# Patient Record
Sex: Male | Born: 1940 | Race: Black or African American | Hispanic: No | Marital: Single
Health system: Southern US, Community
[De-identification: ages and names within clinical notes are randomized; demographics above are authoritative.]

---

## 2005-12-10 ENCOUNTER — Emergency Department: Payer: Self-pay | Admitting: Emergency Medicine

## 2008-03-28 ENCOUNTER — Inpatient Hospital Stay: Payer: Self-pay | Admitting: Internal Medicine

## 2008-06-22 ENCOUNTER — Emergency Department: Payer: Self-pay | Admitting: Emergency Medicine

## 2008-12-01 ENCOUNTER — Emergency Department: Payer: Self-pay | Admitting: Emergency Medicine

## 2008-12-16 ENCOUNTER — Inpatient Hospital Stay: Payer: Self-pay | Admitting: Internal Medicine

## 2009-12-02 ENCOUNTER — Ambulatory Visit: Payer: Self-pay | Admitting: Unknown Physician Specialty

## 2011-02-05 ENCOUNTER — Emergency Department: Payer: Self-pay | Admitting: Emergency Medicine

## 2011-06-01 ENCOUNTER — Ambulatory Visit: Payer: Self-pay | Admitting: Vascular Surgery

## 2011-06-01 LAB — BASIC METABOLIC PANEL
Anion Gap: 8 (ref 7–16)
BUN: 15 mg/dL (ref 7–18)
Co2: 31 mmol/L (ref 21–32)
Creatinine: 1.32 mg/dL — ABNORMAL HIGH (ref 0.60–1.30)
EGFR (African American): >60
EGFR (Non-African Amer.): 57 — ABNORMAL LOW
Glucose: 55 mg/dL — ABNORMAL LOW (ref 65–99)
Potassium: 3.2 mmol/L — ABNORMAL LOW (ref 3.5–5.1)

## 2011-06-01 LAB — PROTIME-INR
INR: 1.8
Prothrombin Time: 21.3 secs — ABNORMAL HIGH (ref 11.5–14.7)

## 2011-12-16 ENCOUNTER — Ambulatory Visit: Payer: Self-pay | Admitting: Cardiology

## 2011-12-16 LAB — CBC WITH DIFFERENTIAL/PLATELET
Basophil #: 0 10*3/uL (ref 0.0–0.1)
Basophil %: 1.1 %
Eosinophil %: 1.3 %
HGB: 13 g/dL (ref 13.0–18.0)
Lymphocyte #: 1 10*3/uL (ref 1.0–3.6)
Lymphocyte %: 28 %
MCH: 27.7 pg (ref 26.0–34.0)
MCHC: 33.4 g/dL (ref 32.0–36.0)
MCV: 83 fL (ref 80–100)
Monocyte #: 0.8 x10 3/mm (ref 0.2–1.0)
Monocyte %: 20.4 %
Platelet: 144 10*3/uL — ABNORMAL LOW (ref 150–440)
RBC: 4.68 10*6/uL (ref 4.40–5.90)

## 2011-12-16 LAB — BASIC METABOLIC PANEL
Anion Gap: 7 (ref 7–16)
BUN: 16 mg/dL (ref 7–18)
Calcium, Total: 8.4 mg/dL — ABNORMAL LOW (ref 8.5–10.1)
Co2: 30 mmol/L (ref 21–32)
Creatinine: 1.34 mg/dL — ABNORMAL HIGH (ref 0.60–1.30)
EGFR (African American): >60
EGFR (Non-African Amer.): 52 — ABNORMAL LOW
Osmolality: 276 (ref 275–301)
Potassium: 3.8 mmol/L (ref 3.5–5.1)

## 2011-12-16 LAB — PROTIME-INR
INR: 2
Prothrombin Time: 23 secs — ABNORMAL HIGH (ref 11.5–14.7)

## 2011-12-24 ENCOUNTER — Ambulatory Visit: Payer: Self-pay | Admitting: Cardiology

## 2011-12-24 LAB — PROTIME-INR
INR: 1.2
Prothrombin Time: 15.3 secs — ABNORMAL HIGH (ref 11.5–14.7)

## 2012-01-30 ENCOUNTER — Inpatient Hospital Stay: Payer: Self-pay | Admitting: Internal Medicine

## 2012-01-30 LAB — COMPREHENSIVE METABOLIC PANEL
Albumin: 3.7 g/dL (ref 3.4–5.0)
Alkaline Phosphatase: 134 U/L (ref 50–136)
Anion Gap: 5 — ABNORMAL LOW (ref 7–16)
BUN: 20 mg/dL — ABNORMAL HIGH (ref 7–18)
Bilirubin,Total: 1.4 mg/dL — ABNORMAL HIGH (ref 0.2–1.0)
Chloride: 104 mmol/L (ref 98–107)
Creatinine: 1.21 mg/dL (ref 0.60–1.30)
Glucose: 44 mg/dL — ABNORMAL LOW (ref 65–99)
Osmolality: 275 (ref 275–301)
Potassium: 3.6 mmol/L (ref 3.5–5.1)
SGOT(AST): 46 U/L — ABNORMAL HIGH (ref 15–37)
Sodium: 138 mmol/L (ref 136–145)
Total Protein: 7.7 g/dL (ref 6.4–8.2)

## 2012-01-30 LAB — CBC
HCT: 35.7 % — ABNORMAL LOW (ref 40.0–52.0)
HGB: 12.1 g/dL — ABNORMAL LOW (ref 13.0–18.0)
MCV: 83 fL (ref 80–100)
Platelet: 135 10*3/uL — ABNORMAL LOW (ref 150–440)
RBC: 4.32 10*6/uL — ABNORMAL LOW (ref 4.40–5.90)
WBC: 6.7 10*3/uL (ref 3.8–10.6)

## 2012-01-30 LAB — PROTIME-INR
INR: 3
Prothrombin Time: 30.9 secs — ABNORMAL HIGH (ref 11.5–14.7)

## 2012-01-30 LAB — CK TOTAL AND CKMB (NOT AT ARMC)
CK, Total: 333 U/L — ABNORMAL HIGH (ref 35–232)
CK, Total: 331 U/L — ABNORMAL HIGH (ref 35–232)
CK-MB: 7.3 ng/mL — ABNORMAL HIGH (ref 0.5–3.6)
CK-MB: 7.4 ng/mL — ABNORMAL HIGH (ref 0.5–3.6)

## 2012-01-30 LAB — TROPONIN I
Troponin-I: 0.26 ng/mL — ABNORMAL HIGH
Troponin-I: 0.24 ng/mL — ABNORMAL HIGH

## 2012-01-31 ENCOUNTER — Ambulatory Visit: Payer: Self-pay | Admitting: Internal Medicine

## 2012-01-31 LAB — PROTIME-INR
INR: 3.3
Prothrombin Time: 33.5 secs — ABNORMAL HIGH (ref 11.5–14.7)

## 2012-01-31 LAB — BASIC METABOLIC PANEL
Anion Gap: 8 (ref 7–16)
BUN: 23 mg/dL — ABNORMAL HIGH (ref 7–18)
Chloride: 103 mmol/L (ref 98–107)
Co2: 29 mmol/L (ref 21–32)
Creatinine: 1.39 mg/dL — ABNORMAL HIGH (ref 0.60–1.30)
EGFR (Non-African Amer.): 50 — ABNORMAL LOW

## 2012-01-31 LAB — CBC WITH DIFFERENTIAL/PLATELET
Basophil %: 0.8 %
Eosinophil #: 0 10*3/uL (ref 0.0–0.7)
Eosinophil %: 0 %
HCT: 35.4 % — ABNORMAL LOW (ref 40.0–52.0)
Lymphocyte #: 1.1 10*3/uL (ref 1.0–3.6)
MCH: 27.4 pg (ref 26.0–34.0)
MCHC: 33.2 g/dL (ref 32.0–36.0)
MCV: 83 fL (ref 80–100)
Monocyte #: 0.8 x10 3/mm (ref 0.2–1.0)
Monocyte %: 12.6 %
Neutrophil #: 4.4 10*3/uL (ref 1.4–6.5)
Platelet: 135 10*3/uL — ABNORMAL LOW (ref 150–440)
RBC: 4.28 10*6/uL — ABNORMAL LOW (ref 4.40–5.90)

## 2012-01-31 LAB — TROPONIN I: Troponin-I: 0.15 ng/mL — ABNORMAL HIGH

## 2012-01-31 LAB — CK TOTAL AND CKMB (NOT AT ARMC)
CK, Total: 322 U/L — ABNORMAL HIGH (ref 35–232)
CK-MB: 4 ng/mL — ABNORMAL HIGH (ref 0.5–3.6)

## 2012-02-01 LAB — BASIC METABOLIC PANEL
Calcium, Total: 8.5 mg/dL (ref 8.5–10.1)
Chloride: 102 mmol/L (ref 98–107)
Creatinine: 1.18 mg/dL (ref 0.60–1.30)
EGFR (Non-African Amer.): >60
Glucose: 71 mg/dL (ref 65–99)
Osmolality: 276 (ref 275–301)
Potassium: 4.4 mmol/L (ref 3.5–5.1)
Sodium: 137 mmol/L (ref 136–145)

## 2012-02-01 LAB — PROTIME-INR
INR: 2.6
Prothrombin Time: 28 secs — ABNORMAL HIGH (ref 11.5–14.7)

## 2012-02-01 LAB — HEMOGLOBIN: HGB: 11.8 g/dL — ABNORMAL LOW (ref 13.0–18.0)

## 2012-02-25 ENCOUNTER — Inpatient Hospital Stay: Payer: Self-pay | Admitting: Internal Medicine

## 2012-02-25 LAB — PROTIME-INR: Prothrombin Time: 26.3 secs — ABNORMAL HIGH (ref 11.5–14.7)

## 2012-02-25 LAB — URINALYSIS, COMPLETE
Bacteria: NONE SEEN
Bilirubin,UR: NEGATIVE
Protein: 30
Squamous Epithelial: NONE SEEN
WBC UR: <1 /HPF (ref 0–5)

## 2012-02-25 LAB — COMPREHENSIVE METABOLIC PANEL
Albumin: 3.6 g/dL (ref 3.4–5.0)
Anion Gap: 8 (ref 7–16)
BUN: 28 mg/dL — ABNORMAL HIGH (ref 7–18)
Bilirubin,Total: 2 mg/dL — ABNORMAL HIGH (ref 0.2–1.0)
Creatinine: 1.18 mg/dL (ref 0.60–1.30)
EGFR (African American): >60
EGFR (Non-African Amer.): >60
Glucose: 154 mg/dL — ABNORMAL HIGH (ref 65–99)
Potassium: 5.6 mmol/L — ABNORMAL HIGH (ref 3.5–5.1)
SGOT(AST): 82 U/L — ABNORMAL HIGH (ref 15–37)
Sodium: 135 mmol/L — ABNORMAL LOW (ref 136–145)
Total Protein: 8 g/dL (ref 6.4–8.2)

## 2012-02-25 LAB — CBC
HGB: 12.9 g/dL — ABNORMAL LOW (ref 13.0–18.0)
MCH: 27.6 pg (ref 26.0–34.0)
MCV: 83 fL (ref 80–100)
Platelet: 137 10*3/uL — ABNORMAL LOW (ref 150–440)
RBC: 4.65 10*6/uL (ref 4.40–5.90)

## 2012-02-26 LAB — BASIC METABOLIC PANEL
Calcium, Total: 8.2 mg/dL — ABNORMAL LOW (ref 8.5–10.1)
Co2: 27 mmol/L (ref 21–32)
EGFR (African American): 44 — ABNORMAL LOW
Glucose: 88 mg/dL (ref 65–99)
Osmolality: 281 (ref 275–301)
Potassium: 3.8 mmol/L (ref 3.5–5.1)
Sodium: 138 mmol/L (ref 136–145)

## 2012-02-26 LAB — CBC WITH DIFFERENTIAL/PLATELET
Basophil #: 0 10*3/uL (ref 0.0–0.1)
Eosinophil %: 0.1 %
Lymphocyte #: 1.1 10*3/uL (ref 1.0–3.6)
MCH: 27.3 pg (ref 26.0–34.0)
MCV: 82 fL (ref 80–100)
Monocyte #: 1.6 x10 3/mm — ABNORMAL HIGH (ref 0.2–1.0)
Monocyte %: 17.3 %
Neutrophil #: 6.6 10*3/uL — ABNORMAL HIGH (ref 1.4–6.5)
Neutrophil %: 70.7 %
Platelet: 123 10*3/uL — ABNORMAL LOW (ref 150–440)
RDW: 16.7 % — ABNORMAL HIGH (ref 11.5–14.5)
WBC: 9.4 10*3/uL (ref 3.8–10.6)

## 2012-02-26 LAB — TROPONIN I: Troponin-I: 17 ng/mL — ABNORMAL HIGH

## 2012-02-26 LAB — MAGNESIUM: Magnesium: 1.9 mg/dL

## 2012-02-26 LAB — CK: CK, Total: 3630 U/L — ABNORMAL HIGH (ref 35–232)

## 2012-02-26 LAB — APTT: Activated PTT: >160 secs (ref 23.6–35.9)

## 2012-02-26 LAB — HEMOGLOBIN: HGB: 11.5 g/dL — ABNORMAL LOW (ref 13.0–18.0)

## 2012-02-27 ENCOUNTER — Ambulatory Visit: Payer: Self-pay | Admitting: Neurology

## 2012-02-27 LAB — BASIC METABOLIC PANEL
Anion Gap: 8 (ref 7–16)
BUN: 31 mg/dL — ABNORMAL HIGH (ref 7–18)
Co2: 29 mmol/L (ref 21–32)
Creatinine: 1.6 mg/dL — ABNORMAL HIGH (ref 0.60–1.30)
EGFR (African American): 49 — ABNORMAL LOW
EGFR (Non-African Amer.): 42 — ABNORMAL LOW
Glucose: 279 mg/dL — ABNORMAL HIGH (ref 65–99)
Osmolality: 292 (ref 275–301)
Sodium: 138 mmol/L (ref 136–145)

## 2012-02-27 LAB — CBC WITH DIFFERENTIAL/PLATELET
Basophil #: 0.1 10*3/uL (ref 0.0–0.1)
Basophil %: 0.6 %
Eosinophil #: 0 10*3/uL (ref 0.0–0.7)
HCT: 34.6 % — ABNORMAL LOW (ref 40.0–52.0)
Lymphocyte %: 6.4 %
MCHC: 34.6 g/dL (ref 32.0–36.0)
Monocyte #: 1.1 x10 3/mm — ABNORMAL HIGH (ref 0.2–1.0)
Monocyte %: 10.1 %
Neutrophil %: 82.9 %
Platelet: 133 10*3/uL — ABNORMAL LOW (ref 150–440)
RBC: 4.2 10*6/uL — ABNORMAL LOW (ref 4.40–5.90)
RDW: 16.4 % — ABNORMAL HIGH (ref 11.5–14.5)
WBC: 10.6 10*3/uL (ref 3.8–10.6)

## 2012-02-27 LAB — URINE CULTURE

## 2012-02-28 LAB — CBC WITH DIFFERENTIAL/PLATELET
Basophil #: 0 10*3/uL (ref 0.0–0.1)
Basophil %: 0.2 %
Eosinophil #: 0 10*3/uL (ref 0.0–0.7)
HCT: 32 % — ABNORMAL LOW (ref 40.0–52.0)
HGB: 11.1 g/dL — ABNORMAL LOW (ref 13.0–18.0)
Lymphocyte #: 0.7 10*3/uL — ABNORMAL LOW (ref 1.0–3.6)
MCH: 28.5 pg (ref 26.0–34.0)
MCHC: 34.7 g/dL (ref 32.0–36.0)
Monocyte #: 0.9 x10 3/mm (ref 0.2–1.0)
Monocyte %: 9.2 %
Neutrophil #: 8.7 10*3/uL — ABNORMAL HIGH (ref 1.4–6.5)
Neutrophil %: 84 %
RBC: 3.9 10*6/uL — ABNORMAL LOW (ref 4.40–5.90)
RDW: 16.7 % — ABNORMAL HIGH (ref 11.5–14.5)
WBC: 10.3 10*3/uL (ref 3.8–10.6)

## 2012-02-28 LAB — BASIC METABOLIC PANEL
Chloride: 106 mmol/L (ref 98–107)
Co2: 30 mmol/L (ref 21–32)
Creatinine: 1.72 mg/dL — ABNORMAL HIGH (ref 0.60–1.30)
EGFR (African American): 45 — ABNORMAL LOW
Potassium: 3.4 mmol/L — ABNORMAL LOW (ref 3.5–5.1)
Sodium: 145 mmol/L (ref 136–145)

## 2012-02-28 LAB — PHOSPHORUS: Phosphorus: 2.9 mg/dL (ref 2.5–4.9)

## 2012-02-28 LAB — POTASSIUM: Potassium: 3.6 mmol/L (ref 3.5–5.1)

## 2012-02-29 LAB — CBC WITH DIFFERENTIAL/PLATELET
Basophil #: 0 10*3/uL (ref 0.0–0.1)
Basophil %: 0.4 %
Eosinophil %: 0 %
HCT: 32.4 % — ABNORMAL LOW (ref 40.0–52.0)
HGB: 10.6 g/dL — ABNORMAL LOW (ref 13.0–18.0)
Lymphocyte #: 0.9 10*3/uL — ABNORMAL LOW (ref 1.0–3.6)
MCH: 27.1 pg (ref 26.0–34.0)
MCV: 83 fL (ref 80–100)
Monocyte #: 1.2 x10 3/mm — ABNORMAL HIGH (ref 0.2–1.0)
Monocyte %: 11.7 %
Neutrophil #: 7.9 10*3/uL — ABNORMAL HIGH (ref 1.4–6.5)
RBC: 3.91 10*6/uL — ABNORMAL LOW (ref 4.40–5.90)
WBC: 10 10*3/uL (ref 3.8–10.6)

## 2012-02-29 LAB — BASIC METABOLIC PANEL
Anion Gap: 6 — ABNORMAL LOW (ref 7–16)
BUN: 31 mg/dL — ABNORMAL HIGH (ref 7–18)
Chloride: 107 mmol/L (ref 98–107)
Co2: 30 mmol/L (ref 21–32)
Creatinine: 1.54 mg/dL — ABNORMAL HIGH (ref 0.60–1.30)
EGFR (Non-African Amer.): 44 — ABNORMAL LOW
Potassium: 3.8 mmol/L (ref 3.5–5.1)
Sodium: 143 mmol/L (ref 136–145)

## 2012-03-01 LAB — CBC WITH DIFFERENTIAL/PLATELET
Basophil %: 0.2 %
Eosinophil %: 0 %
HCT: 31.8 % — ABNORMAL LOW (ref 40.0–52.0)
HGB: 10.4 g/dL — ABNORMAL LOW (ref 13.0–18.0)
Lymphocyte %: 3.2 %
MCHC: 32.6 g/dL (ref 32.0–36.0)
MCV: 83 fL (ref 80–100)
Monocyte %: 3.1 %
Neutrophil #: 10.6 10*3/uL — ABNORMAL HIGH (ref 1.4–6.5)
Neutrophil %: 93.5 %
WBC: 11.3 10*3/uL — ABNORMAL HIGH (ref 3.8–10.6)

## 2012-03-01 LAB — BASIC METABOLIC PANEL
Anion Gap: 8 (ref 7–16)
BUN: 29 mg/dL — ABNORMAL HIGH (ref 7–18)
Calcium, Total: 8.4 mg/dL — ABNORMAL LOW (ref 8.5–10.1)
Chloride: 110 mmol/L — ABNORMAL HIGH (ref 98–107)
Co2: 27 mmol/L (ref 21–32)
Creatinine: 1.35 mg/dL — ABNORMAL HIGH (ref 0.60–1.30)
Potassium: 4.3 mmol/L (ref 3.5–5.1)
Sodium: 145 mmol/L (ref 136–145)

## 2012-03-01 LAB — CULTURE, BLOOD (SINGLE)

## 2012-03-02 ENCOUNTER — Ambulatory Visit: Payer: Self-pay | Admitting: Internal Medicine

## 2012-03-02 LAB — BASIC METABOLIC PANEL
Anion Gap: 6 — ABNORMAL LOW (ref 7–16)
BUN: 38 mg/dL — ABNORMAL HIGH (ref 7–18)
Chloride: 110 mmol/L — ABNORMAL HIGH (ref 98–107)
EGFR (African American): 55 — ABNORMAL LOW
EGFR (Non-African Amer.): 48 — ABNORMAL LOW
Glucose: 217 mg/dL — ABNORMAL HIGH (ref 65–99)
Osmolality: 302 (ref 275–301)
Sodium: 144 mmol/L (ref 136–145)

## 2012-03-02 LAB — URINALYSIS, COMPLETE
Bacteria: NONE SEEN
Bilirubin,UR: NEGATIVE
Ketone: NEGATIVE
Leukocyte Esterase: NEGATIVE
Nitrite: NEGATIVE
Ph: 6 (ref 4.5–8.0)
Protein: NEGATIVE
RBC,UR: 49 /HPF (ref 0–5)
Specific Gravity: 1.023 (ref 1.003–1.030)
Squamous Epithelial: NONE SEEN
WBC UR: NONE SEEN /HPF (ref 0–5)

## 2012-03-02 LAB — CBC WITH DIFFERENTIAL/PLATELET
Basophil %: 0.2 %
Eosinophil #: 0 10*3/uL (ref 0.0–0.7)
Eosinophil %: 0 %
HGB: 9.9 g/dL — ABNORMAL LOW (ref 13.0–18.0)
Lymphocyte %: 2.6 %
MCHC: 31.7 g/dL — ABNORMAL LOW (ref 32.0–36.0)
MCV: 84 fL (ref 80–100)
Neutrophil #: 12.2 10*3/uL — ABNORMAL HIGH (ref 1.4–6.5)
Neutrophil %: 92.7 %
Platelet: 180 10*3/uL (ref 150–440)
WBC: 13.2 10*3/uL — ABNORMAL HIGH (ref 3.8–10.6)

## 2012-03-03 LAB — BASIC METABOLIC PANEL
Anion Gap: 5 — ABNORMAL LOW (ref 7–16)
BUN: 39 mg/dL — ABNORMAL HIGH (ref 7–18)
Calcium, Total: 8.1 mg/dL — ABNORMAL LOW (ref 8.5–10.1)
Chloride: 108 mmol/L — ABNORMAL HIGH (ref 98–107)
EGFR (African American): >60
Glucose: 166 mg/dL — ABNORMAL HIGH (ref 65–99)
Osmolality: 294 (ref 275–301)

## 2012-03-03 LAB — URINE CULTURE

## 2012-03-04 LAB — CBC WITH DIFFERENTIAL/PLATELET
Basophil #: 0 10*3/uL (ref 0.0–0.1)
Basophil %: 0.1 %
Eosinophil #: 0 10*3/uL (ref 0.0–0.7)
Eosinophil %: 0 %
HCT: 30.8 % — ABNORMAL LOW (ref 40.0–52.0)
HGB: 10.5 g/dL — ABNORMAL LOW (ref 13.0–18.0)
Lymphocyte #: 1.2 10*3/uL (ref 1.0–3.6)
Lymphocyte %: 7.2 %
MCH: 28.2 pg (ref 26.0–34.0)
MCHC: 34 g/dL (ref 32.0–36.0)
Neutrophil #: 14.5 10*3/uL — ABNORMAL HIGH (ref 1.4–6.5)
Platelet: 225 10*3/uL (ref 150–440)
WBC: 16.9 10*3/uL — ABNORMAL HIGH (ref 3.8–10.6)

## 2012-03-04 LAB — BASIC METABOLIC PANEL
Anion Gap: 8 (ref 7–16)
BUN: 37 mg/dL — ABNORMAL HIGH (ref 7–18)
Chloride: 110 mmol/L — ABNORMAL HIGH (ref 98–107)
Co2: 26 mmol/L (ref 21–32)
Creatinine: 1.4 mg/dL — ABNORMAL HIGH (ref 0.60–1.30)
EGFR (African American): 57 — ABNORMAL LOW
EGFR (Non-African Amer.): 49 — ABNORMAL LOW
Potassium: 4.4 mmol/L (ref 3.5–5.1)
Sodium: 144 mmol/L (ref 136–145)

## 2012-03-05 ENCOUNTER — Ambulatory Visit: Payer: Self-pay | Admitting: Neurology

## 2012-03-05 LAB — BASIC METABOLIC PANEL
Anion Gap: 9 (ref 7–16)
BUN: 47 mg/dL — ABNORMAL HIGH (ref 7–18)
Calcium, Total: 8 mg/dL — ABNORMAL LOW (ref 8.5–10.1)
Chloride: 110 mmol/L — ABNORMAL HIGH (ref 98–107)
Co2: 25 mmol/L (ref 21–32)
EGFR (African American): 50 — ABNORMAL LOW
EGFR (Non-African Amer.): 43 — ABNORMAL LOW
Glucose: 186 mg/dL — ABNORMAL HIGH (ref 65–99)
Osmolality: 304 (ref 275–301)
Potassium: 4.7 mmol/L (ref 3.5–5.1)
Sodium: 144 mmol/L (ref 136–145)

## 2012-03-05 LAB — CBC WITH DIFFERENTIAL/PLATELET
Bands: 2 %
HCT: 31 % — ABNORMAL LOW (ref 40.0–52.0)
HGB: 10.3 g/dL — ABNORMAL LOW (ref 13.0–18.0)
Lymphocytes: 9 %
MCH: 27.8 pg (ref 26.0–34.0)
MCHC: 33.2 g/dL (ref 32.0–36.0)
Metamyelocyte: 1 %
Monocytes: 3 %
NRBC/100 WBC: 2 /
Platelet: 256 10*3/uL (ref 150–440)
WBC: 20.7 10*3/uL — ABNORMAL HIGH (ref 3.8–10.6)

## 2012-03-06 LAB — BASIC METABOLIC PANEL
Anion Gap: 7 (ref 7–16)
BUN: 53 mg/dL — ABNORMAL HIGH (ref 7–18)
Calcium, Total: 8 mg/dL — ABNORMAL LOW (ref 8.5–10.1)
Chloride: 110 mmol/L — ABNORMAL HIGH (ref 98–107)
Co2: 27 mmol/L (ref 21–32)
Potassium: 4.5 mmol/L (ref 3.5–5.1)
Sodium: 144 mmol/L (ref 136–145)

## 2012-03-06 LAB — CBC WITH DIFFERENTIAL/PLATELET
Eosinophil #: 0.1 10*3/uL (ref 0.0–0.7)
Lymphocyte #: 0.6 10*3/uL — ABNORMAL LOW (ref 1.0–3.6)
MCH: 27.3 pg (ref 26.0–34.0)
MCHC: 32.8 g/dL (ref 32.0–36.0)
Neutrophil #: 20.7 10*3/uL — ABNORMAL HIGH (ref 1.4–6.5)
Neutrophil %: 91.5 %
Platelet: 268 10*3/uL (ref 150–440)
WBC: 22.6 10*3/uL — ABNORMAL HIGH (ref 3.8–10.6)

## 2012-03-08 LAB — BASIC METABOLIC PANEL
Co2: 26 mmol/L (ref 21–32)
Creatinine: 1.61 mg/dL — ABNORMAL HIGH (ref 0.60–1.30)
EGFR (Non-African Amer.): 42 — ABNORMAL LOW
Glucose: 150 mg/dL — ABNORMAL HIGH (ref 65–99)
Potassium: 4.8 mmol/L (ref 3.5–5.1)

## 2012-03-08 LAB — CBC WITH DIFFERENTIAL/PLATELET
Eosinophil #: 0 10*3/uL (ref 0.0–0.7)
Eosinophil %: 0 %
HGB: 12 g/dL — ABNORMAL LOW (ref 13.0–18.0)
Lymphocyte #: 0.7 10*3/uL — ABNORMAL LOW (ref 1.0–3.6)
Lymphocyte %: 2.6 %
MCHC: 36.1 g/dL — ABNORMAL HIGH (ref 32.0–36.0)
MCV: 84 fL (ref 80–100)
Monocyte #: 2 x10 3/mm — ABNORMAL HIGH (ref 0.2–1.0)
Neutrophil #: 25.7 10*3/uL — ABNORMAL HIGH (ref 1.4–6.5)
Neutrophil %: 90 %
Platelet: 306 10*3/uL (ref 150–440)
WBC: 28.5 10*3/uL — ABNORMAL HIGH (ref 3.8–10.6)

## 2012-03-08 LAB — CULTURE, BLOOD (SINGLE)

## 2012-04-02 ENCOUNTER — Ambulatory Visit: Payer: Self-pay | Admitting: Internal Medicine

## 2012-04-02 DEATH — deceased

## 2013-06-07 IMAGING — CT CT HEAD WITHOUT CONTRAST
2 series · 15 of 30 positions shown, 19 images · non-contrast
Comparison: none

REASON FOR EXAM: unresponsive
COMMENTS:

PROCEDURE:     CT  - CT HEAD WITHOUT CONTRAST  - February 25, 2012  [DATE]
RESULT:     Comparison:  12/01/2008
TECHNIQUE: Multiple axial images from the foramen magnum to the vertex were
obtained without IV contrast.

[Series 2: without · axial · non-contrast · 0.46mm/px · z∈[+250,+384]mm · 13 of 33 slices shown, 17 images]
[im 3/33  brain]
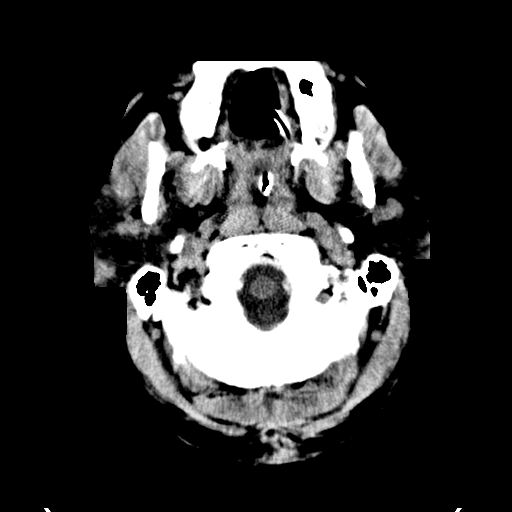
[im 3/33  bone]
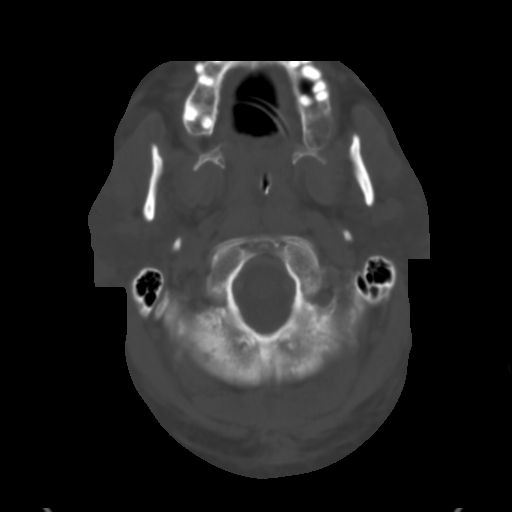
[im 5/33  brain]
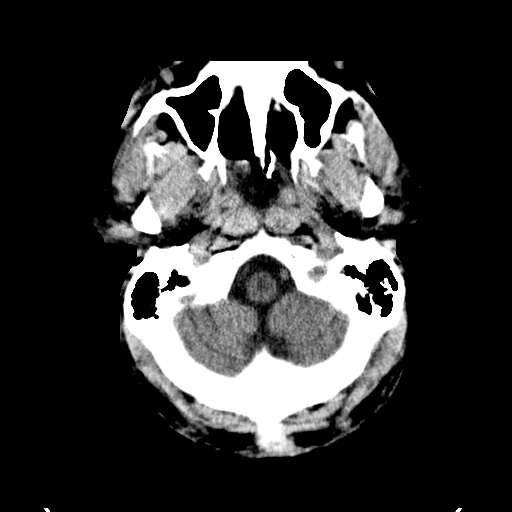
[im 7/33  brain]
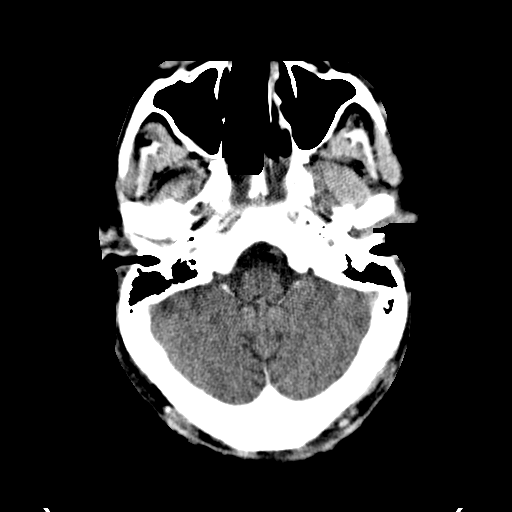
[im 10/33  brain]
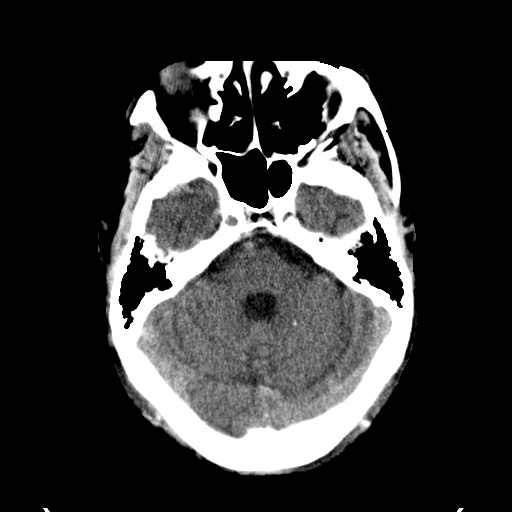
[im 12/33  brain]
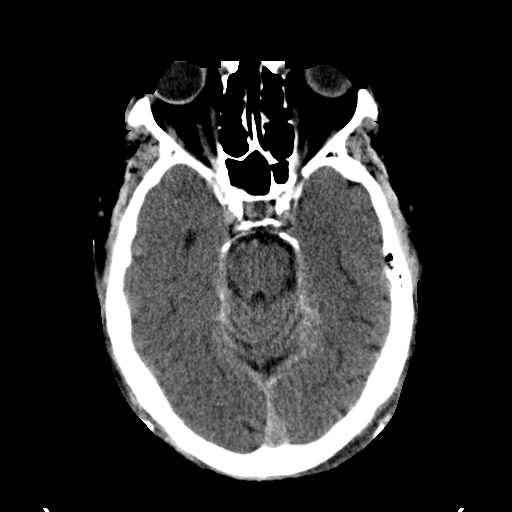
[im 12/33  bone]
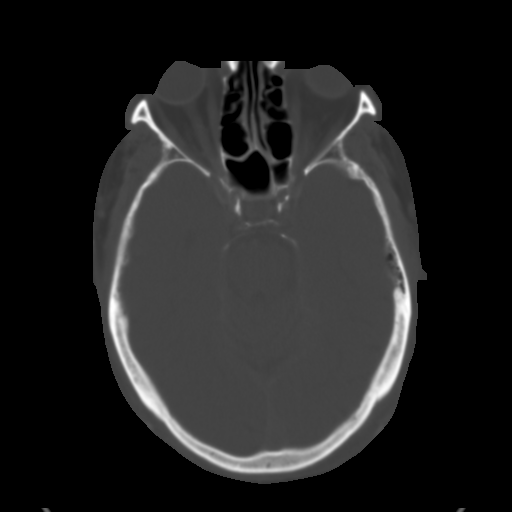
[im 14/33  brain]
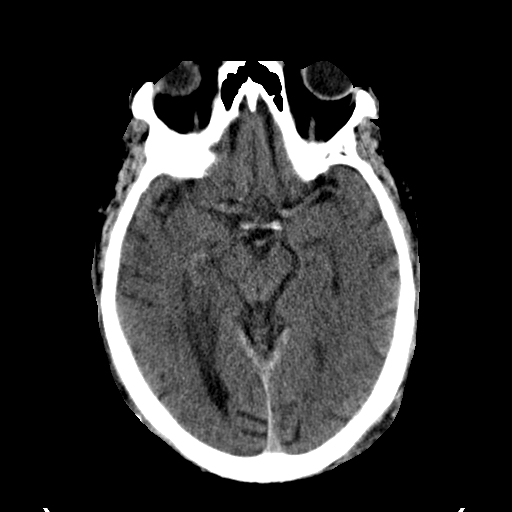
[im 17/33  brain]
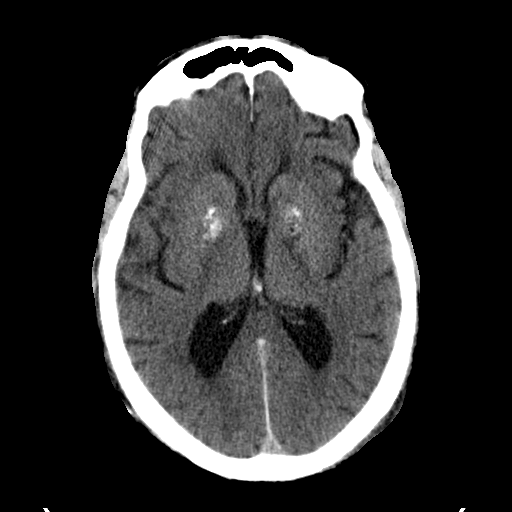
[im 19/33  brain]
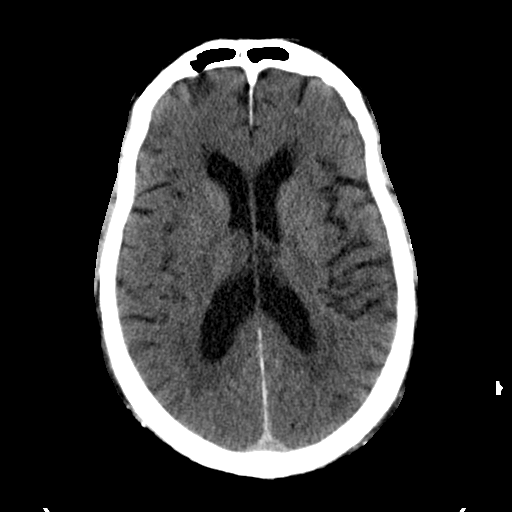
[im 21/33  brain]
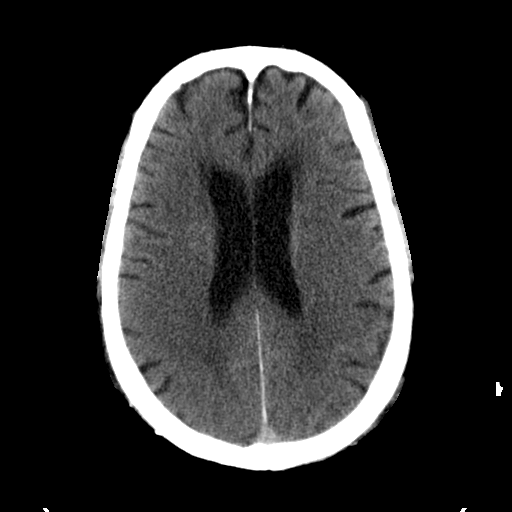
[im 21/33  bone]
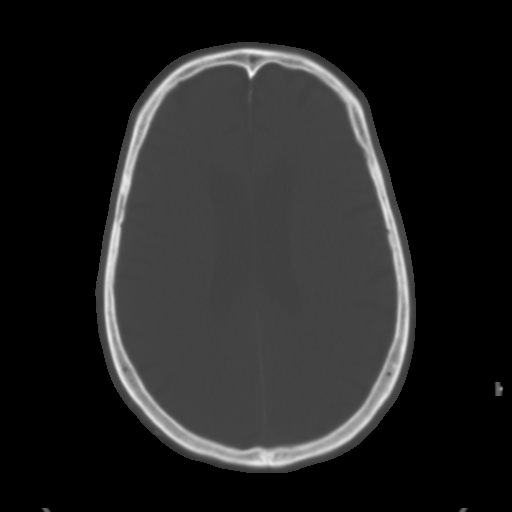
[im 23/33  brain]
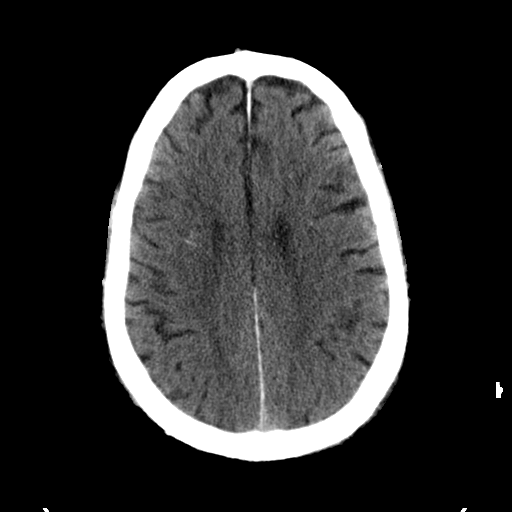
[im 26/33  brain]
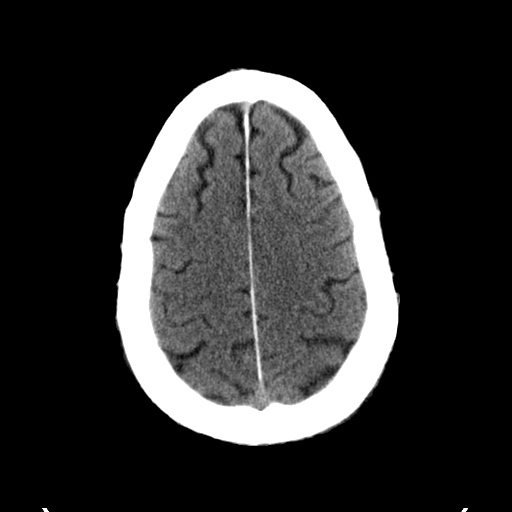
[im 28/33  brain]
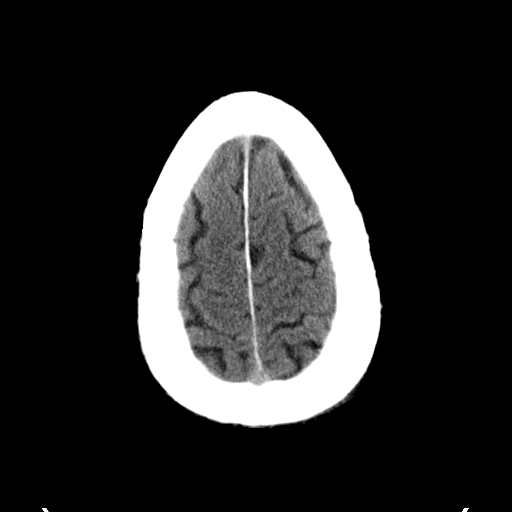
[im 30/33  brain]
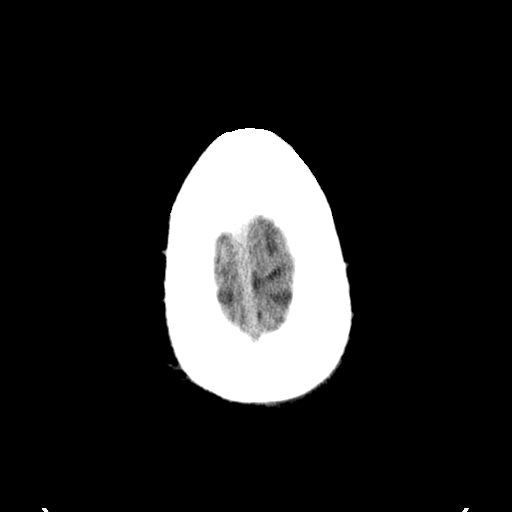
[im 30/33  bone]
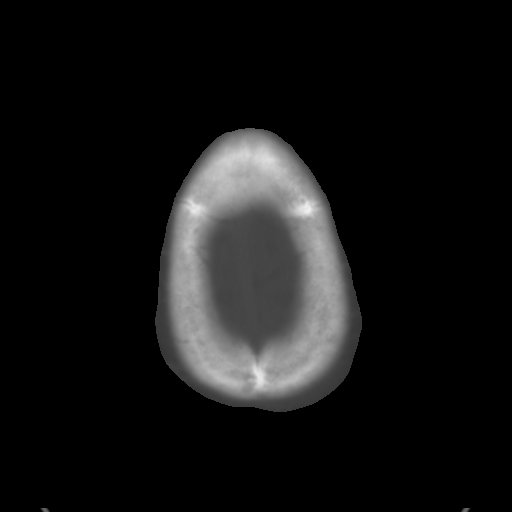

[Series 3: bone · axial · 0.46mm/px · z∈[+250,+270]mm · 2 of 33 slices shown]
[im 3/33  bone]
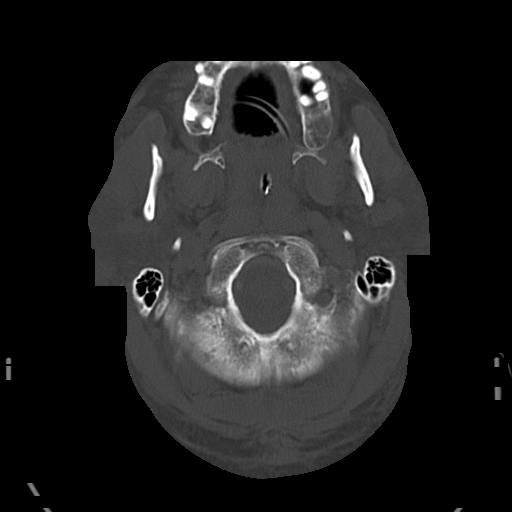
[im 7/33  bone]
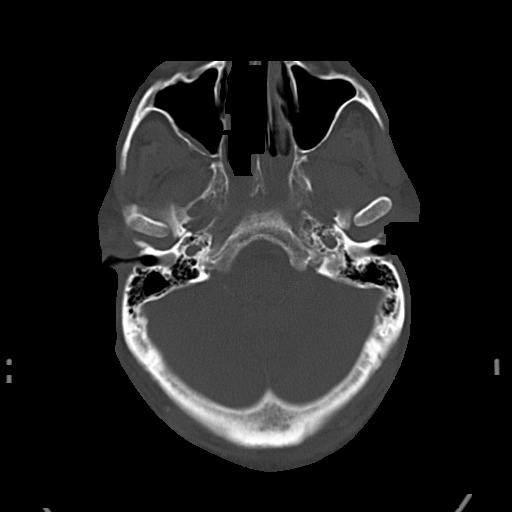

[15 of 30 positions shown; findings below may reference images not displayed]

FINDINGS: There is no evidence for mass effect, midline shift, or extra-axial fluid
collections. There is no evidence for space-occupying lesion, intracranial
hemorrhage, or cortical-based area of infarction. Periventricular and
subcortical hypoattenuation is consistent with chronic small vessel ischemic
disease.

There is a trace amount of mucosal thickening in the right maxillary sinus.
An enteric tube and endotracheal tube are incompletely visualized.

The osseous structures are unremarkable.
IMPRESSION: 1. No acute intracranial process.
2. Chronic small vessel ischemic disease.

CT can underestimate ischemia in the first 24 hours after the event. If
there is clinical concern for an acute infarct, a followup MRI or repeat CT
scan in 24 hours may provide additional information.

[REDACTED]

## 2013-06-09 IMAGING — CT CT HEAD WITHOUT CONTRAST
1 series · 16 of 30 positions shown, 20 images · non-contrast
Comparison: none

REASON FOR EXAM: coma
COMMENTS:

PROCEDURE:     CT  - CT HEAD WITHOUT CONTRAST  - February 27, 2012  [DATE]
RESULT:     History: Coma.
Comparison Study: Prior CT of 02/25/2012.

[Series 2: soft tissue · axial · 0.44mm/px · z∈[-163,-13]mm · 16 of 34 slices shown, 20 images]
[im 2/34  brain]
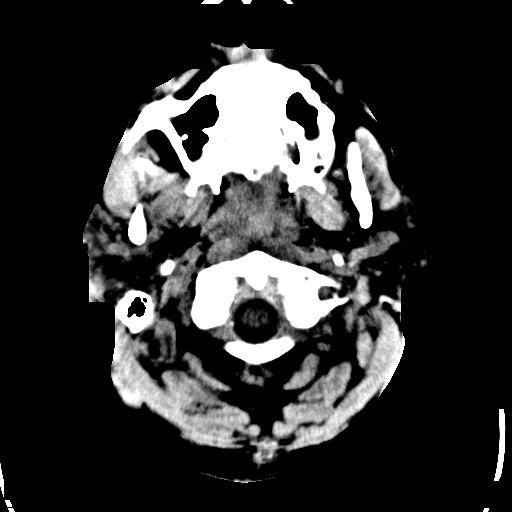
[im 2/34  bone]
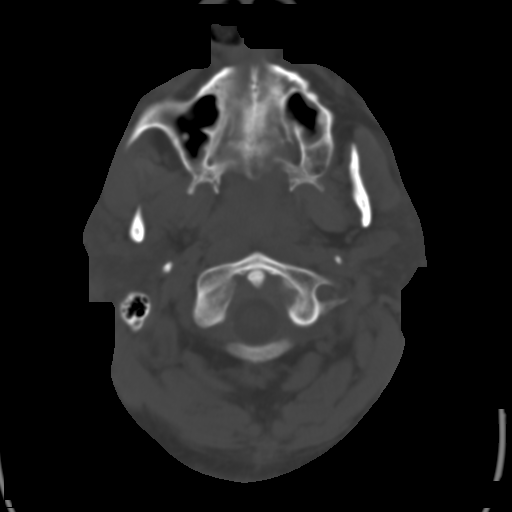
[im 4/34  brain]
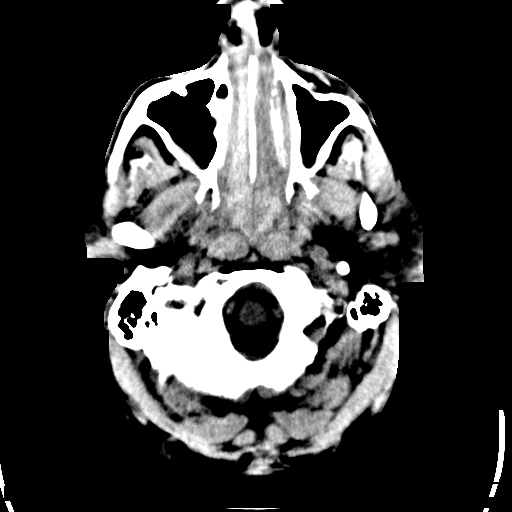
[im 6/34  brain]
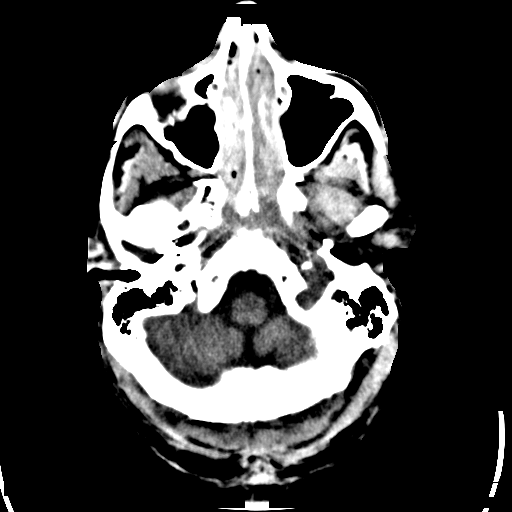
[im 8/34  brain]
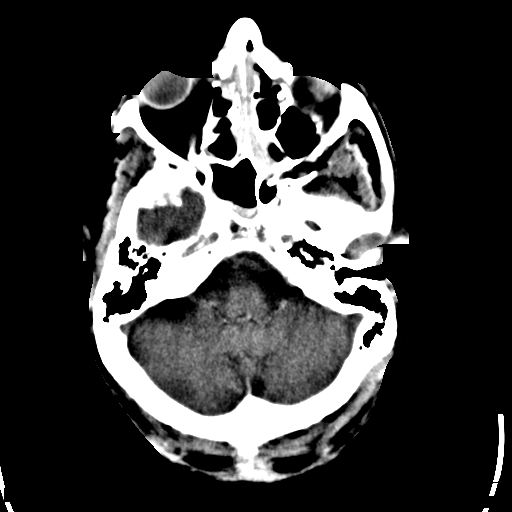
[im 10/34  brain]
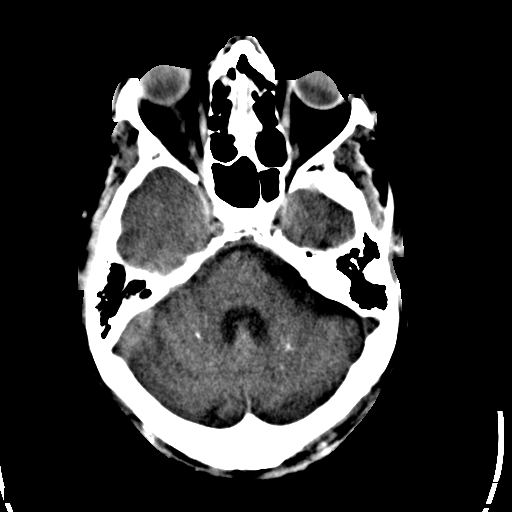
[im 10/34  bone]
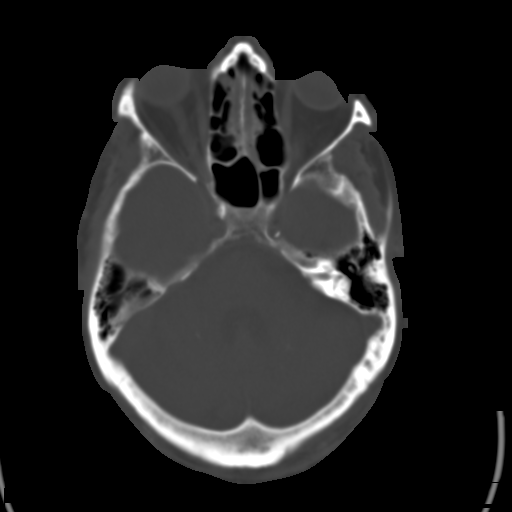
[im 12/34  brain]
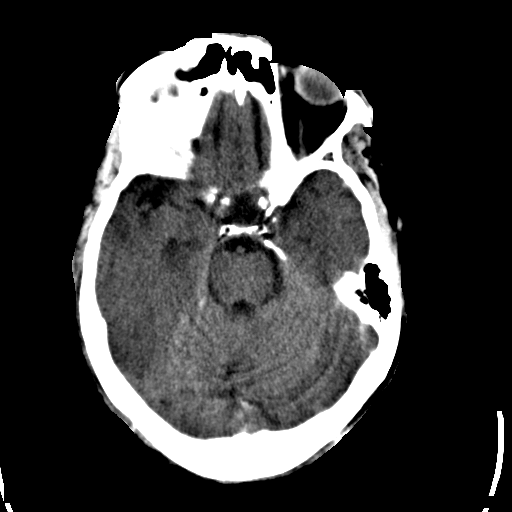
[im 14/34  brain]
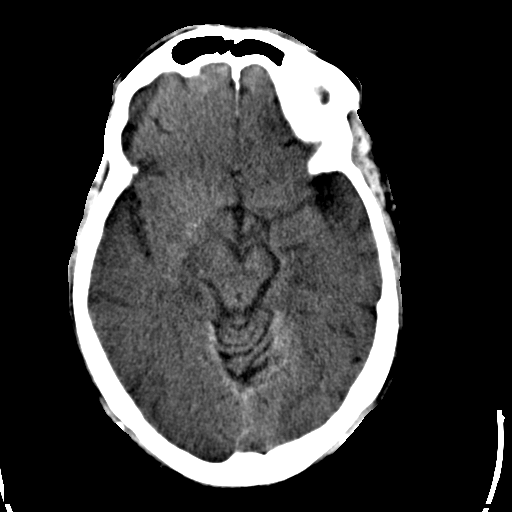
[im 16/34  brain]
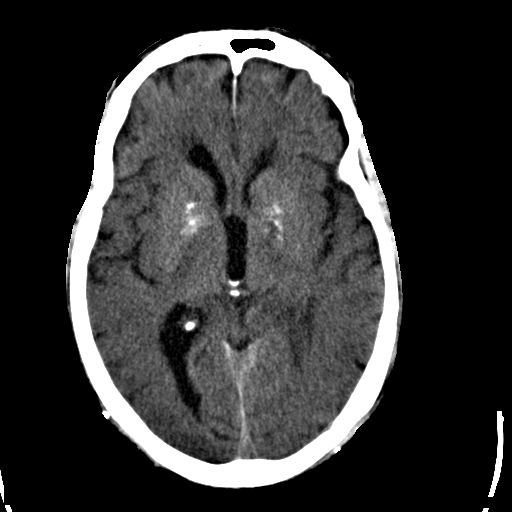
[im 18/34  brain]
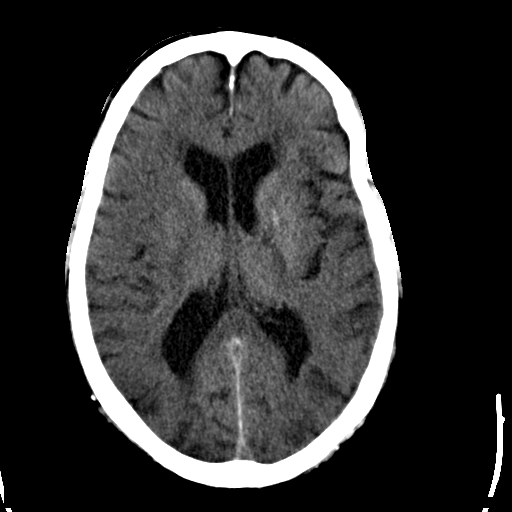
[im 18/34  bone]
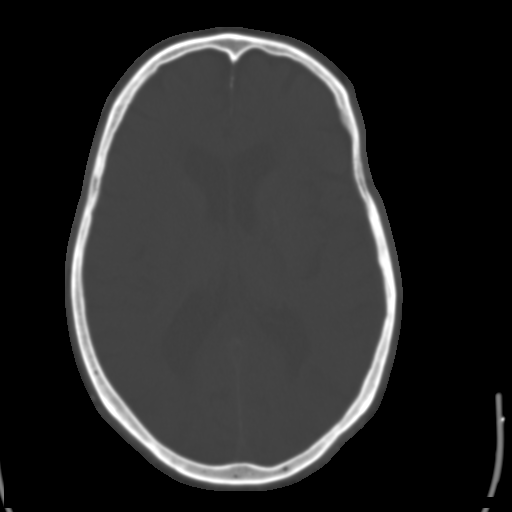
[im 20/34  brain]
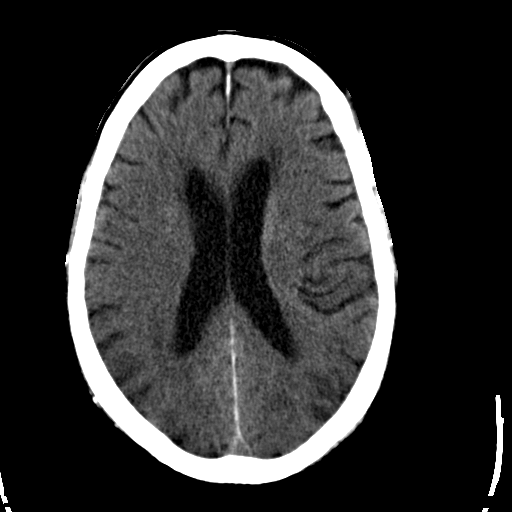
[im 22/34  brain]
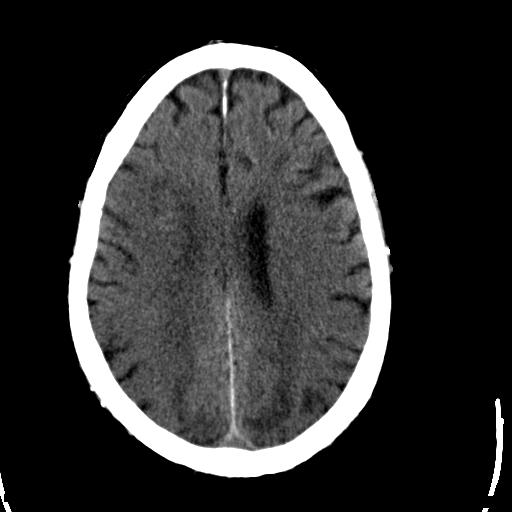
[im 24/34  brain]
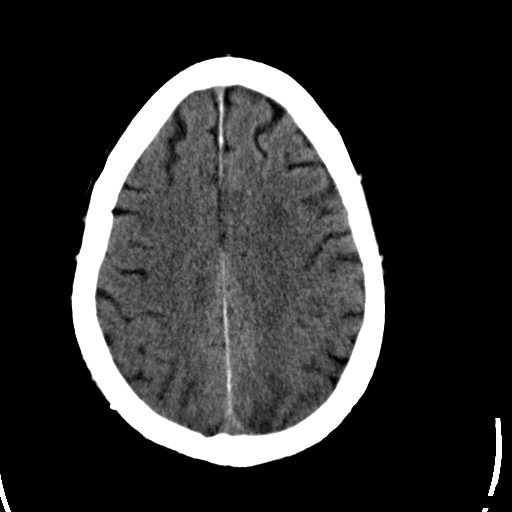
[im 26/34  brain]
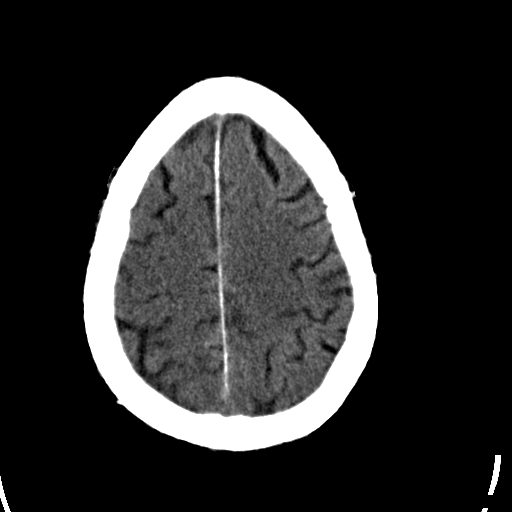
[im 26/34  bone]
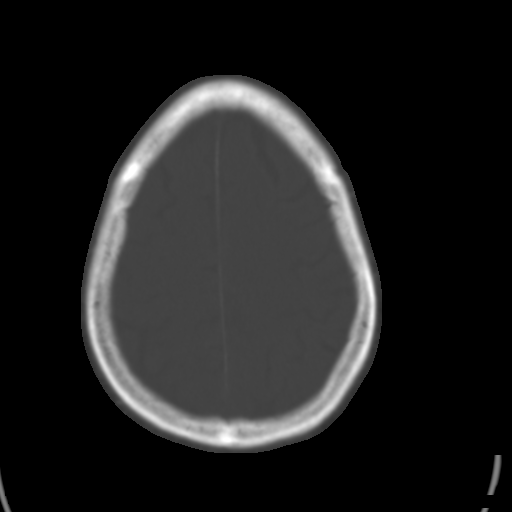
[im 28/34  brain]
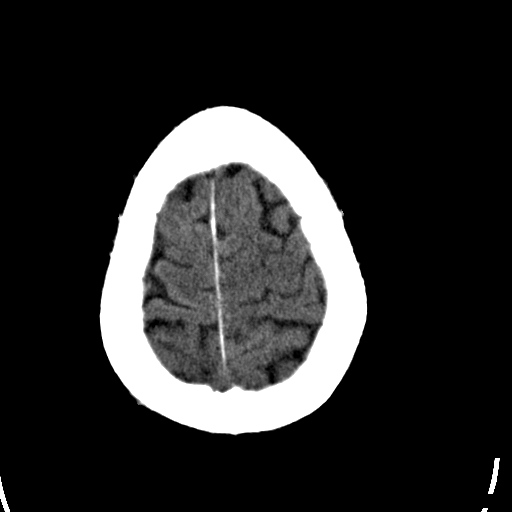
[im 30/34  brain]
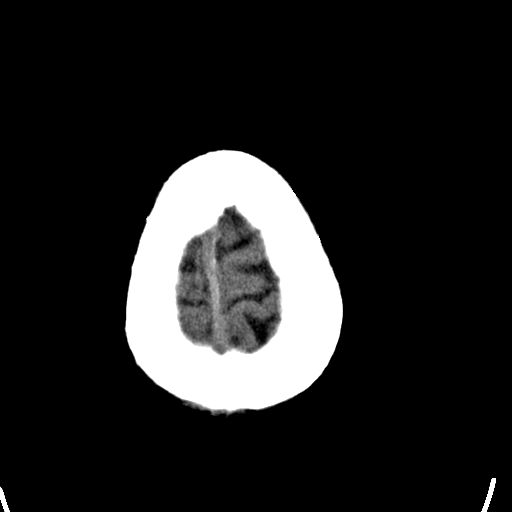
[im 32/34  brain]
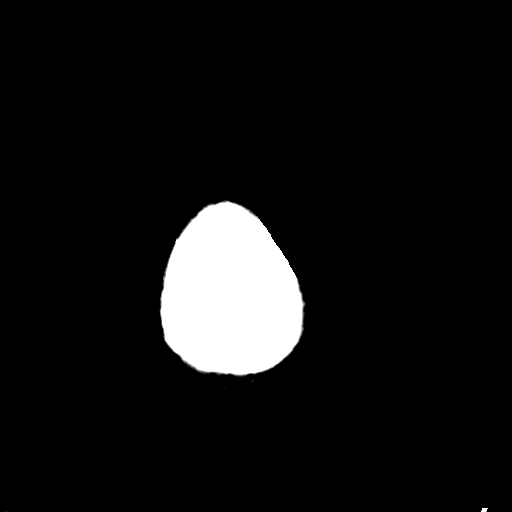

[16 of 30 positions shown; findings below may reference images not displayed]

FINDINGS: Standard nonenhanced CT obtained. Ill-defined gray-white matter
distinction noted in the occipital lobes bilaterally. This is a new finding
consistent with infarcts. Similar finding of the left temporal lobe. No
hydrocephalus. Basal ganglia calcification. No acute hemorrhage. No acute
bony abnormality. Mucosal thickening noted in the paranasal sinuses
diffusely. Nasopharyngeal mucosal thickening present is severe. The nasal
airway is occluded. Orotracheal tube noted.
IMPRESSION: Probable infarcts in both occipital lobes and left temporal
lobe. No hemorrhage.

## 2013-06-09 IMAGING — CR DG CHEST 1V PORT
1 series · 1 of 1 positions shown · non-contrast
Comparison: none

REASON FOR EXAM: Respiratory Failure/Interval Changes
COMMENTS:

[ap]
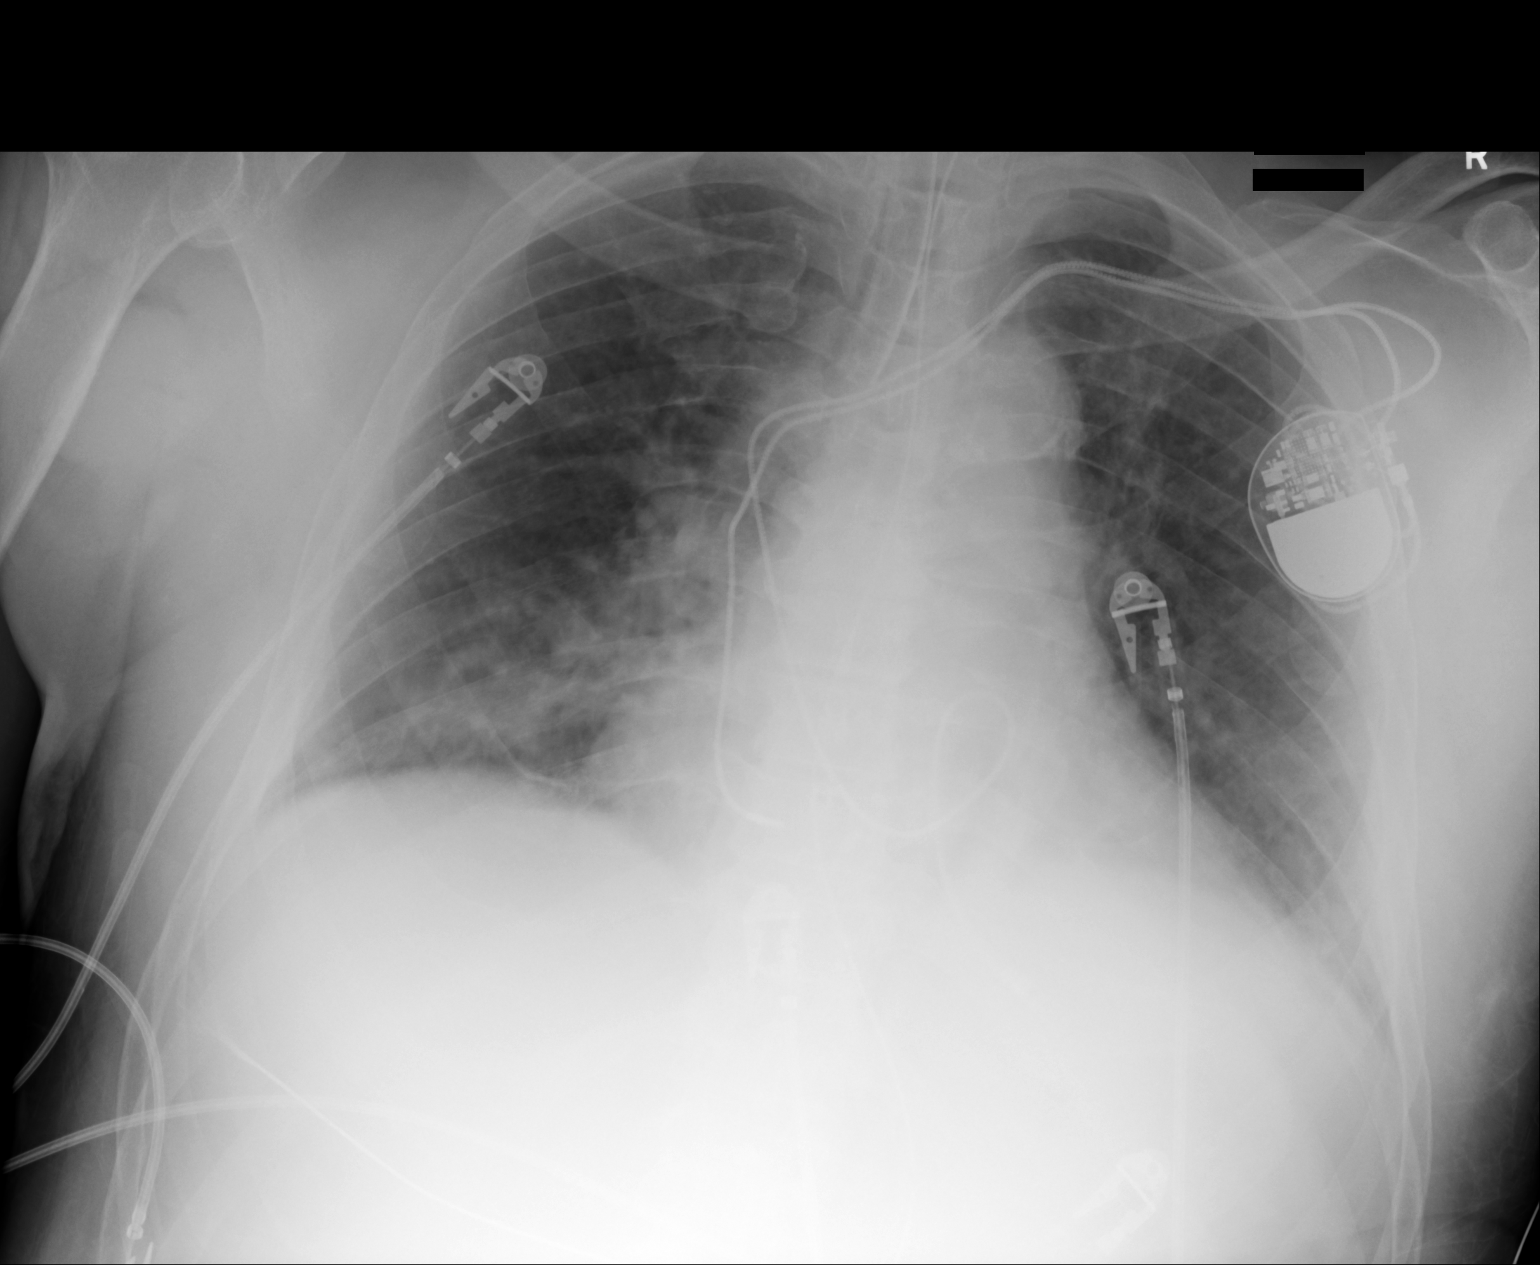

[1 of 1 positions shown; findings below may reference images not displayed]

PROCEDURE:     DXR - DXR PORTABLE CHEST SINGLE VIEW  - February 27, 2012  [DATE]

RESULT:     Endotracheal tube and NG tube in good position. Cardiac
pacemaker noted with lead tips in good position. Interim partial clearing of
bilateral pulmonary infiltrates consistent with and clearing congestive
heart failure noted. Persistent infiltrate in the right lung base consistent
with pneumonia.
IMPRESSION: 1. Interim partial clearing of pulmonary edema with mild residual infiltrate
right lung base.
2. Interval replacement of NG tube with tip in good position.

## 2014-06-19 NOTE — Consult Note (Signed)
Brief Consult Note: Diagnosis: Acute on chronic diastolic chf with afib and sick sinus on appropriate meds now with nose bleed and minimal elevation of troponin due to demand ishcemia.   Patient was seen by consultant.   Consult note dictated.   Comments: -lasix as needed for chf -hold coumadin for nose bleedi and high inr -echo for lv dysfunciton -ambulate and follow for improvemtn of sx -no furhter intervetnion of minimal elevation of tropoinin.  Electronic Signatures: Lamar BlinksKowalski, Nieshia Larmon J (MD)  (Signed 01-Dec-13 16:41)  Authored: Brief Consult Note   Last Updated: 01-Dec-13 16:41 by Lamar BlinksKowalski, Lorriann Hansmann J (MD)

## 2014-06-19 NOTE — H&P (Signed)
PATIENT NAME:  Kyle Contreras, Kyle Contreras MR#:  [id number] DATE OF BIRTH:  01-Nov-1940  DATE OF ADMISSION:  01/30/2012  PRIMARY CARE PHYSICIAN: Angus PalmsSionne George, MD    HISTORY OF PRESENT ILLNESS: The patient is a 74 year old African American male with past medical history significant for history of congestive heart failure, diastolic,  history of recent admission to the hospital for congestive heart failure in October 2013, presented back to the hospital with complaints of shortness of breath for the past 3 or 4 days, also some increasing weight and increased worsening swelling of lower extremities as well as abdomen. In the Emergency Room, he was noted to be somewhat hypoxic and was placed on oxygen therapy. He was also given 80 mg of Lasix IV after which he diuresed approximately 1.2 liters and feels a little bit better now with shortness of breath.   PAST MEDICAL/SURGICAL HISTORY: Significant for: 1. History of pulmonary edema.  2. History of congestive heart failure, left diastolic. 3. History of mild-to-moderate tricuspid regurgitation as well as left ventricular hypertrophy on echocardiogram.  4. History of chronic obstructive pulmonary disease. 5. Bronchitis exacerbation in October 2010.  6. History of hypoxia.  7. Diabetes mellitus, type 2, insulin-dependent. In  2010, the patient's hemoglobin A1c was 8.7.  8. History of periumbilical abdominal pain which resolved on proton pump inhibitors.  9. History of atrial fibrillation, chronic, on Coumadin therapy anticoagulation.  10. Hypertension.  11. Renal insufficiency.  12. Cardiomyopathy by echocardiogram, not confirmed on Myoview stress test. Ejection fraction of 68%. As mentioned above, mild regurgitation, tricuspid regurgitation as well as mild left ventricular hypertrophy was noted during that admission in 2010.  13. The patient also had peripheral vascular disease with left lower extremity ulceration. He had aortic runoff done by Dr. Wyn Quakerew which showed  less than 50% stenosis in superficial femoral artery, also popliteal artery.  14. Sick sinus syndrome,  status post dual chamber pacemaker placement in 2001.  15. History of pacemaker generator changed in October 2013. 16. History of diabetes mellitus, as mentioned above. 17. History of gastroesophageal reflux disease.  18. Transient ischemic attacks in 2001 leading to pacemaker placement as well.  19. Seasonal allergies.  20. Atrial fibrillation, as mentioned above.   MEDICATIONS: The patient's medication list is as follows:  1. Amlodipine 10 mg p.o. daily.  2. Aspirin 81 mg p.o. daily.  3. Atorvastatin 80 mg p.o. daily.  4. Clonidine 0.1 mg p.o. once daily. 5. Imdur 30 mg p.o. daily.  6. Lasix 40 mg p.o. twice daily. 7. Metoprolol succinate ER 200 mg p.o. daily.  8. Novolin N 100 units in the morning and 68 units at bedtime.  9. Omeprazole 20 mg p.o. daily.  10. Warfarin 7.5 mg p.o. on Mondays and Fridays and 5 mg p.o. on Tuesdays, Wednesdays, Thursdays as well as Saturdays and Sundays.   ALLERGIES: No known drug allergies.   SOCIAL HISTORY: He smoked for approximately seven years on and off and then stopped approximately 15 years ago. No alcohol or recreational drugs. He lives alone. He is a retired Paediatric nursebarber.   FAMILY HISTORY: Diabetes in the patient's mother as well as glaucoma.   REVIEW OF SYSTEMS: Positive for weight gain, bifocal glasses, lower extremity swelling, dyspnea on exertion. CONSTITUTIONAL: He denies any fevers, chills, fatigue, weakness, pains, weight loss but admits of some weight gain. EYES: In regards to eyes, denies any blurry vision, double vision, glaucoma, or cataracts. ENT: Denies any tinnitus, allergies, epistaxis, sinus pain, dentures, difficulty swallowing. Admits  of using bifocal glasses. RESPIRATORY: Admits of some cough, no significant phlegm production.  No wheezes, no hemoptysis. Admits of shortness of breath, especially on exertion. CARDIOVASCULAR: Denies  any chest pains, no orthopnea, no arrhythmias, palpitations or syncope. GASTROINTESTINAL: Denies any nausea, vomiting, diarrhea, or constipation. GENITOURINARY: Denies dysuria, hematuria, frequency, or incontinence. ENDOCRINOLOGY: Denies any polydipsia, nocturia, thyroid problems, heat or cold intolerance or thirst. HEMATOLOGIC: Denies anemia, easy bruisability, bleeding, or swollen glands. SKIN: Denies any acne, rashes, lesions, or change in moles. MUSCULOSKELETAL: Denies arthritis, cramps, swelling, or gout. NEUROLOGIC: Denies numbness, epilepsy, or tremor. PSYCHIATRIC: Denies anxiety, insomnia, or depression.     PHYSICAL EXAMINATION:  VITAL SIGNS: On arrival to the hospital, temperature is 98.1, pulse 87, respiratory rate 34, blood pressure 137/60, saturation 93% on room air.   GENERAL: This is a well-developed, well-nourished [place] male, obese, no significant distress, no significant discomfort.   HEENT: His pupils are equal and reactive to light. Extraocular muscles are intact.  No icterus or conjunctivitis. He has normal hearing. No pharyngeal erythema. Mucosa is moist.   NECK: Neck did not reveal any masses, supple, nontender. Thyroid not enlarged. No adenopathy. No JVD or carotid bruits bilaterally. Full range of motion.   LUNGS: Clear to auscultation in all fields, somewhat diminished breath sounds at bases. No rales, rhonchi, or wheezes or labored inspirations were noted. Not in overt respiratory distress.   CARDIOVASCULAR: S1 and S2 appreciated.  No murmur, gallop or rub. Rhythm was regular. PMI not lateralized.   CHEST: Nontender to palpation. Lower extremity edema 2+. No calf tenderness or cyanosis was noted.   ABDOMEN: Soft, moderately firm, protuberant, even distended. No hepatosplenomegaly or masses were noted.   RECTAL: Deferred. Bowel sounds were present but diminished.   MUSCULOSKELETAL: Muscle strength: Able to move all extremities. No cyanosis, degenerative joint  disease, or kyphosis. Gait is not tested.   SKIN: Skin did not reveal any rashes, lesions, erythema, nodularity, or induration. It was warm and dry to palpation.   LYMPH: No adenopathy in the cervical region.   NEUROLOGICAL: Cranial nerves are grossly intact. Sensory is intact. No dysarthria or aphasia. The patient is alert, oriented to person and place, poorly cooperative. Memory is impaired, and the patient is somewhat confused. No abnormalities otherwise were found.   LABORATORY, DIAGNOSTIC AND RADIOLOGICAL DATA: The patient's EKG showed electronic ventricular pacemaker at rate of 60 beats per minute, no other abnormalities. Lab data: Glucose level is low at 44.0. B-type natriuretic peptide was slightly elevated at 987.0. BUN was slightly elevated at 20, otherwise BMP was unremarkable. The patient's liver enzymes showed a total bilirubin of 1.4, AST elevated to 46. CK total was 333, MB fraction 7.3. Troponin is elevated at 0.26. White blood cell count is normal at 6.7, hemoglobin 12.1, platelet count 135. Coagulation panel: Pro time was 3.9 and INR was 3.0. Chest x-ray, portable, revealed congestive heart failure and pulmonary interstitial edema.   ASSESSMENT/PLAN:  1. Congestive heart failure, left heart, acute on chronic, diastolic: Admit the patient to a Medical floor, telemetry. Continue him on Imdur as well as diuresis with IV Lasix. Follow urine output. Add Kyle inhibitor if the patient's blood pressure tolerates and if creatinine does not increase significantly.  2. Hypertension: We will continue beta blockers, Imdur, add also Kyle inhibitor and watch creatinine levels.  3. Chronic obstructive pulmonary disease: We will continue the patient on Duo-Nebs.  4. Diabetes mellitus: We will continue insulin, cut down insulin doses to a little lower  doses because of his hypoglycemia, also renal insufficiency.  5. Renal insufficiency: We will follow with diuresis as well as Kyle inhibitor addition.   6. Atrial fibrillation: I will suspend Coumadin as the patient's coagulation is somewhat supratherapeutic. I will continue beta blockers. The patient's heart rate is controlled.  7. Elevated troponin: The patient was seen by Dr. Darrold Junker in 2010. At that time the patient had a Myoview stress test which showed inferior scar. We will be holding Coumadin and will get cardiologist again involved for recommendations.   TIME SPENT: 50 minutes.  ____________________________ Katharina Caper, MD rv:cbb D: 01/30/2012 12:35:21 ET T: 01/30/2012 13:36:33 ET JOB#: [id number] cc: Angus Palms, MD Niyanna Asch MD ELECTRONICALLY SIGNED 02/13/2012 16:14

## 2014-06-19 NOTE — Consult Note (Signed)
PATIENT NAME:  Kyle Contreras, Kyle Contreras MR#:  [id number] DATE OF BIRTH:  1940-11-03  DATE OF CONSULTATION:  01/31/2012  REFERRING PHYSICIAN:  Dr. Winona LegatoVaickute   CONSULTING PHYSICIAN:  Lamar BlinksBruce J. Io Dieujuste, MD  PRIMARY CARE PHYSICIAN:  Dr. Greggory StallionGeorge  REASON FOR CONSULTATION: Unstable angina with elevation of troponin, atrial fibrillation, coronary artery disease, and congestive heart failure.   CHIEF COMPLAINT: "I'm short of breath."   HISTORY OF PRESENT ILLNESS: This is a 74 year old male with known diastolic dysfunction, congestive heart failure with acute on chronic diastolic dysfunction congestive heart failure with pulmonary edema, abdominal edema, and shortness of breath. The patient did have a minimal elevation of troponin most consistent with demand ischemia and no evidence of myocardial infarction. EKG had shown atrial fibrillation with ventricular pacing with recent pacemaker generator change-out and no evidence of significant issues. At that time the patient had some mild renal insufficiency as well as anemia but currently is stable. He has had reasonable heart rate control with his current medical regimen. Blood pressure has been previously controlled. The patient has not had any evidence for further chest pain or shortness of breath after intravenous Lasix and improvement of symptoms with oxygen.   REVIEW OF SYSTEMS:  The remainder of the review of systems negative for vision change, ringing in the ears, hearing loss, cough, congestion, heartburn, nausea, vomiting, diarrhea, bloody stools, stomach pain, extremity pain, leg weakness, cramping of the buttocks, known blood clots, headaches, blackouts, dizzy spells, nosebleeds, congestion, trouble swallowing, frequent urination, urination at night, muscle weakness, numbness, anxiety, depression, skin lesions, or skin rashes.   PAST MEDICAL HISTORY:  1. Sick sinus syndrome and atrial fibrillation.  2. Diastolic dysfunction and congestive heart failure.   3. Hypertension.  4. Hyperlipidemia.   FAMILY HISTORY: No family members with early onset of cardiovascular disease or hypertension.   SOCIAL HISTORY: Currently denies alcohol or tobacco use.   ALLERGIES: No known drug allergies.   CURRENT MEDICATIONS: As listed.   PHYSICAL EXAMINATION:  VITAL SIGNS: Blood pressure 136/68 bilaterally, heart rate 72 upright, reclining, and regular.   GENERAL: He is a well-appearing male in no acute distress.   HEENT: No icterus, thyromegaly, ulcers, hemorrhage, or xanthelasma.   HEART: Regular rate and rhythm with normal S1 and S2, 2/6 apical murmur consistent with mitral regurgitation. Point of maximal impulse is normal size and placement. Carotid upstroke normal without bruit. Jugular venous pressure is normal.   LUNGS: Lungs have few basilar crackles with normal respirations.   ABDOMEN: Soft, nontender with increased abdominal girth due to possible congestion.   EXTREMITIES: 2+ radial, femoral, dorsal pedal pulses with trace lower extremity edema. No cyanosis, clubbing, or ulcers.   NEUROLOGIC: The patient is oriented to time, place, and person with normal mood and affect.   ASSESSMENT: This is a 74 year old male with hypertension, hyperlipidemia, diastolic dysfunction, congestive heart failure, and renal insufficiency having acute on chronic diastolic congestive heart failure with edema and shortness of breath and elevation of troponin most consistent with demand ischemia. No current evidence of myocardial infarction.   RECOMMENDATIONS:  1. Continue Coumadin for further risk reduction in stroke with chronic atrial fibrillation.  2. Intravenous Lasix for diastolic dysfunction, congestive heart failure, and edema.  3. Echocardiogram for extent of LV systolic dysfunction, valvular heart disease contributing to above, and further adjustments of medications. 4. Heart rate control with beta blocker as necessary.  5. Hypertension control with Kyle  inhibitor if able.  6. No further cardiac intervention for minimal elevation  of troponin most consistent with demand ischemia rather than myocardial infarction  7. Ambulate and follow for improvements of symptoms listed above.      ____________________________ Lamar Blinks, MD bjk:bjt D: 01/31/2012 17:11:08 ET T: 02/01/2012 09:14:12 ET JOB#: [id number]  cc: Lamar Blinks, MD, <Dictator> Lamar Blinks MD ELECTRONICALLY SIGNED 02/04/2012 13:51

## 2014-06-19 NOTE — Op Note (Signed)
PATIENT NAME:  Kyle Contreras, [name] MR#:  [id number] DATE OF BIRTH:  Sep 02, 1940  DATE OF PROCEDURE:  12/24/2011  PREPROCEDURE DIAGNOSES:  1. Sick sinus syndrome.  2. Elective replacement indication.   PROCEDURE: Single-chamber pacemaker generator change-out.   POSTPROCEDURE DIAGNOSIS: Intermittent ventricular pacing.   ATTENDING: Marcina MillardAlexander Auna Mikkelsen, MD   INDICATION: The patient is a 74 year old gentleman who is status post dual-chamber pacemaker for sick sinus syndrome. Recent pacemaker interrogation was performed which revealed that the pacemaker is in VVIR mode since 2009 with underlying rhythm of atrial fibrillation. It also showed the pacemaker was at elective replacement indication starting on 08/25/2011. Procedure, risks, benefits, and alternatives of pacemaker generator change-out were explained to the patient and informed written consent was obtained.   DESCRIPTION OF PROCEDURE: He was brought to the operating room in a fasting state. The left pectoral region was prepped and draped in the usual sterile manner. Anesthesia was obtained with 1% Xylocaine locally. A 6 cm incision was performed over the left pectoral region. The old pacemaker generator was retrieved by electrocautery and blunt dissection. The leads were disconnected from the old pacemaker generator. The atrial lead was capped and tied with 0 silk. The ventricular lead was interrogated and connected to a new single-chamber rate responsive pacemaker generator (Adapta [id number]) and positioned. The pacemaker pocket was irrigated with gentamicin solution. The pacemaker generator was positioned in the pocket and the pocket was closed with 2-0 and 4-0 Vicryl, respectively. Steri-Strips and pressure dressing were applied.   ____________________________ Marcina MillardAlexander Emil Weigold, MD ap:drc D: 12/24/2011 13:31:09 ET T: 12/24/2011 14:07:47 ET JOB#: [id number]  cc: Marcina MillardAlexander Kelaiah Escalona, MD, <Dictator> Marcina MillardALEXANDER Shenice Dolder MD ELECTRONICALLY SIGNED  12/31/2011 14:11

## 2014-06-19 NOTE — Consult Note (Signed)
Brief Consult Note: Diagnosis: apthous ulcer.   Patient was seen by consultant.   Consult note dictated.   Recommend further assessment or treatment.   Comments: Consulted for "large polyp on base of tongue".  Examination reveales 5mm area of ulceration of left lateral tongue near junction of anterior 2/3 of tongue and posterior 1/3.  No other areas of concern noted.  Impression:  apthous ulcer  Rec'd topical kenalog in orobase TID.  Electronic Signatures: Clydell Sposito, Rayfield Citizenreighton Charles (MD)  (Signed 01-Dec-13 12:47)  Authored: Brief Consult Note   Last Updated: 01-Dec-13 12:47 by Flossie DibbleVaught, Elic Vencill Charles (MD)

## 2014-06-19 NOTE — Consult Note (Signed)
PATIENT NAME:  Kyle Contreras, Kyle Contreras MR#:  [id number] DATE OF BIRTH:  [birth year]  DATE OF CONSULTATION:  01/31/2012  REFERRING PHYSICIAN:  Katharina Caperima Vaickute, MD   CONSULTING PHYSICIAN:  Kyung Ruddreighton C. Adlee Paar, MD  REASON FOR CONSULTATION: "Large bulb located at end of tongue and back of the throat."   HISTORY OF PRESENT ILLNESS: The patient is a 74 year old male who was admitted for congestive heart failure. While he was in the hospital he reported an area of irritation of his left lateral tongue. This came up several days ago. He is unsure what it was and was questioning. There was some concern, apparently, of something in the back of his tongue, a large bulb. The patient denies any dysphagia. He does have some respiratory problems related to his congestive heart failure, but he denies any restrictive breathing. He has had some bleeding from his chin, but he denies anything from his mouth.  He does have a little bit of  mild xerostomia with Lasix.   PAST MEDICAL HISTORY: Significant for: 1. Pulmonary edema.  2. Congestive heart failure.  3. Tricuspid regurgitation.  4. Chronic obstructive pulmonary disease.  5. Bronchitis.  6. Diabetes, type 2. 7. Atrial fibrillation. 8. Hypertension.  9. Renal insufficiency.  10. Cardiomyopathy.  11. Peripheral vascular disease.  12. Sick sinus syndrome.   PAST SURGICAL HISTORY: Pacemaker implant.   CURRENT MEDICATIONS: Current medications are reviewed and documented in the electronic medical record.   ALLERGIES: No known drug allergies.   SOCIAL HISTORY: The patient has a history of smoking in the distant past. He denies any significant alcohol. 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   FAMILY HISTORY: Diabetes and glaucoma.   REVIEW OF SYSTEMS: Positive for weight gain, lower extremity swelling, shortness of breath on exertion and oral cavity pain. All others are negative.   PHYSICAL EXAMINATION:  VITAL SIGNS: Temperature is 97.5, pulse 79, respirations  18, blood pressure 136/71, pulse oximetry is 92 on 2 liters.   GENERAL: He is a well-nourished, well-developed male in no acute distress, alert and oriented x3.   RESPIRATORY: There is no significant stridor or stertor. There is no use of accessory muscles. He does have some diminished breath sounds and wheezing.   HEENT: Ears: There is no significant otorrhea. External ears are clear. Nose: Clear to anterior examination with no mucopus or polyps. Oral cavity and oropharynx: No abnormal masses or lesions. He has a 5 mm oval ulceration of his left lateral tongue consistent with an aphthous ulcer. There is no significant induration surrounding it. There is no other abnormal mass or lesion noted or other areas of concern. Chin: He does have an area where he cut himself shaving this morning with some bright red blood but nothing significant. There is no other additional mass or lesion.   NECK: Neck reveals no lymphadenopathy or thyromegaly.   IMPRESSION: Aphthous ulcer.   PLAN: I discussed my findings with the patient, and there is absolutely nothing of concern on his oral cavity evaluation. He has a small aphthous ulcer that may be traumatic in nature. I have recommended some topical Kenalog to be applied to that area to see if we can calm it down and get it to resolve. He does not need followup regarding this.  ____________________________ Kyung Ruddreighton C. Eymi Lipuma, MD ccv:cbb D: 01/31/2012 12:55:09 ET T: 01/31/2012 13:19:58 ET JOB#: [id number] Kyung RuddREIGHTON C Clydell Sposito MD ELECTRONICALLY SIGNED 02/01/2012 16:46

## 2014-06-19 NOTE — H&P (Signed)
PATIENT NAME:  Kyle Contreras, Kyle Contreras MR#:  [id number] DATE OF BIRTH:  1940-05-29  DATE OF ADMISSION:  02/25/2012  PRIMARY CARE PHYSICIAN: Dr. Angus Palms.   REFERRING PHYSICIAN: Dr. Daryel November.   CHIEF COMPLAINT: Unresponsiveness.   HISTORY OF PRESENT ILLNESS: The patient is a 74 year old African American male who lives at home alone. He has a history of diastolic congestive heart failure, history of systemic hypertension, chronic obstructive pulmonary disease, diabetes mellitus type 2, atrial fibrillation, chronic anticoagulation with Coumadin. The patient last time seen was 24 hours ago, and he was fine apart from reporting that he fell a couple of days ago and he had a small abrasion to the forehead area. Today, the family tried to reach him, but he was not answering the telephone or knocking on the door. Finally, the family called the fire department, and they broke the door down and they found him in his bed unresponsive. There was a little vomit around his mouth, and his clothes were soaked with urine. Upon EMS arrival, his blood sugar noted to be 20. He received dextrose 50 intravenously and transferred to the Emergency Department. Here, the patient remains in a coma. He was intubated and connected to a ventilator for airway protection. The initial workup here was unremarkable, including CAT scan of the head. The patient now is in the process to be admitted to the intensive care unit for further evaluation and monitoring.   REVIEW OF SYSTEMS: A 10-point system review is unobtainable due to the patient being in a coma.   PAST MEDICAL HISTORY: Last admission here was on January 30, 2012, when he was admitted with acute on chronic diastolic congestive heart failure and bilateral pleural effusion. The patient also has systemic hypertension, chronic atrial fibrillation, chronic anticoagulation on Coumadin, chronic obstructive pulmonary disease, home oxygen dependent on 2 liters, diabetes mellitus  type 2, chronic kidney disease, chronic respiratory failure, sick sinus syndrome status post dual-chamber pacemaker placement in 2001, history of pacemaker generator change in October 2013, gastroesophageal reflux disease, transient ischemic attack in 2001.   PAST SURGICAL HISTORY: Pacemaker insertion.   SOCIAL HABITS: Not obtainable but according to the medical records, he smoked for approximately 7 years on and off and then stopped smoking about 15 years ago. No alcoholism or drug abuse.   SOCIAL HISTORY: He is divorced, lives at home alone. He is a retired Paediatric nurse.   FAMILY HISTORY: According to the records, his mother suffered from diabetes mellitus and glaucoma.   ADMISSION MEDICATIONS: His list upon discharge from the hospital on December 2, he was on amlodipine 10 mg a day, clonidine 0.1 mg once a day, Imdur 30 mg a day, Lasix 40 mg twice a day, atorvastatin 80 mg once a day, metoprolol succinate 200 mg once a day, Novolin N 68 units at bedtime and 100 units in the morning, warfarin or Coumadin 5 mg on Tuesday, Wednesday, Thursday, Saturday and Sunday and 7.5 mg on other days, omeprazole 20 mg a day, lisinopril 5 mg twice a day, DuoNebs or albuterol with ipratropium 1 inhalation 4 times a day, Advair Diskus 250/50 one inhalation twice a day, oxygen 2 liters via nasal cannula continuously.   ALLERGIES: No known drug allergies.   PHYSICAL EXAMINATION:  VITAL SIGNS: Blood pressure 150/58, respiratory rate 14 while the patient is on ventilator, pulse 72, temperature 96.5, oxygen saturation 99%.  GENERAL APPEARANCE: Elderly male lying in bed unresponsive.  HEAD AND NECK: No pallor. No icterus. No cyanosis. Ear examination: Could  not assess hearing due to the patient being in a coma. There are no visible lesions or discharge. Nasal mucosa was normal without bleeding. No discharge. No ulcers. Oropharyngeal area could not examine adequately due to intubation. Eye examination revealed normal eyelids  and conjunctivae. Pupils about 4 to 5 mm, equal, round and sluggishly reactive to light. Neck is supple. Trachea at midline. No thyromegaly. No cervical lymphadenopathy. No masses.  HEART: Normal S1, S2. No S3 or S4. No murmur. No gallop. No carotid bruits.  RESPIRATORY: The patient is breathing while intubated and connected to a ventilator. Lungs are clear by auscultation. No rales. No wheezing.  ABDOMEN: Obese, soft, without guarding. No rigidity. Bowel sounds were present but sluggish. No masses. No hernias.  SKIN: No ulcers. No subcutaneous nodules.  MUSCULOSKELETAL: No joint swelling. No clubbing.  NEUROLOGIC: The patient is unresponsive. Does not react to commands nor to tactile stimulus. No facial asymmetry. Pupils were about 4 mm, round, equal and reactive to light. I noted that the right foot is slightly colder compared to the left foot. Distal pulses are poor. Could not feel dorsalis pedis.  PSYCHIATRIC: Unobtainable due to the patient being unresponsive.   LABORATORY FINDINGS: CAT scan of the head showed no acute intracranial process. Chronic small vessel ischemic disease. Chest x-ray revealed bilateral heterogenous opacities. May be secondary to pulmonary edema. These opacities are slightly more focal in the right lower lung, and infection is not excluded. Endotracheal tube is approximately 8 mm above the carina. Blood sugar is 154. Again earlier, his blood sugar at home upon arrival of EMS was 20. B-type natriuretic peptide was 1351. BUN 28, creatinine 1.1, sodium 135, potassium 5.6. Liver function tests were fine except for bilirubin of 1 and AST 82. Troponin is elevated at 1.8. Of note, his troponin was also elevated earlier this month. The levels were 0.26, 0.2.4, 0.15. CBC showed white count of 8000, hemoglobin 12.9, hematocrit 38, platelet count 137. Prothrombin time 26. INR 2.4. Urinalysis unremarkable. Arterial blood gas showed a pH of 7.34, pCO2 45, pO2 88 while the patient is on  ventilator.   ASSESSMENT:  1. Coma: The etiology is unclear but at least we one leading cause is hypoglycemia. If this is persistent hypoglycemia, then this could be the culprit. However, I cannot rule out anoxic brain damage from other sources like cardiac arrest with spontaneous resolution of the cardio circulatory process. Other etiology is a stroke, although the CAT scan is negative at the time being.  2. Hypoglycemia: This improved after receiving dextrose 50.  3. Elevated troponin: Etiology is unclear whether he had non-ST elevation myocardial infarction or other reasons like increased demand. His EKG showed junctional rhythm with flipped T waves in the anterolateral leads, also in the anterior leads. Comparison to the old EKGs was not helpful since the previous tracing was a pacemaker rhythm.  4. Hyperkalemia with potassium of 5.6.  5. Mild hyponatremia.  6. Pulmonary edema with or without aspiration pneumonia, especially right lower lung field.  7. Mild metabolic acidosis and slight elevation of lactic acidosis.  8. His other medical problems include chronic obstructive pulmonary disease home oxygen dependent, congestive heart failure with diastolic dysfunction, diabetes mellitus type 2, chronic kidney disease, chronic atrial fibrillation, chronic anticoagulation with Coumadin and systemic hypertension.   PLAN: Will admit the patient to the intensive care unit. The patient will be kept on the ventilator for airway protection. I will taper the sedation and discontinue that to see if there is  any meaningful recovery. Obtain neurology consultation. Neuro exams q.2 hours. Place the patient on Accu-Chek and sliding scale. Treat the hyperkalemia. The patient received Lasix 40 mg additionally, dextrose 50 one ampulla along with 6 units of insulin intravenously. Repeat basic metabolic profile in the morning. Follow up on troponin. Repeat arterial blood gas in the morning. Historical information was taken  from the family, in particular his son and his daughter. Of note, his daughter is a Engineer, civil (consulting)nurse working here in this hospital. I answered all of the family's questions.   Time spent in evaluating this patient took more than 1 hour.   The patient's code status is FULL CODE.    ADDENDUM : 2nd troponin is significantly elevated consistent with NSTEMI. I started IV heparin and added aspirin.   ____________________________ Carney CornersAmir M. Rudene Rearwish, MD amd:gb D: 02/26/2012 00:12:37 ET T: 02/26/2012 03:53:03 ET JOB#: [id number]  cc: Carney CornersAmir M. Rudene Rearwish, MD, <Dictator> Karolee OhsAMIR Dala DockM Kimbella Heisler MD ELECTRONICALLY SIGNED 02/26/2012 6:18

## 2014-06-19 NOTE — Consult Note (Signed)
Comments   Josh Borders, NP, and I met with pt's 3 daughters, his son and his sister. Updated them on pt's current medical status. They understand that pt's prognosis is poor. They all agree that pt would never want to be maintained longterm on a vent so they would not consider trach/PEG. They do want to see what EEG shows (to be done today). We decided to meet at 10am Thursday, 1/2 after results of EEG known. Will decide at that time how family would like to proceed.  expressed appreciation for meeting. All questions answered.   Electronic Signatures: Mikias Lanz, Izora Gala (MD)  (Signed 30-Dec-13 15:31)  Authored: Palliative Care   Last Updated: 30-Dec-13 15:31 by Latrica Clowers, Izora Gala (MD)

## 2014-06-19 NOTE — Discharge Summary (Signed)
PATIENT NAME:  Kyle Contreras, Kyle Contreras MR#:  [id number] DATE OF BIRTH:  09-17-1940  DATE OF ADMISSION:  01/30/2012 DATE OF DISCHARGE:  02/01/2012  ADMITTING DIAGNOSIS: Congestive heart failure, left heart, acute on chronic, diastolic.   DISCHARGE DIAGNOSES:  1. Congestive heart failure, left heart, acute on chronic, diastolic likely. Echocardiogram is not done, needs to be done in next few days after discharge.  2. Bilateral pleural effusions, anasarca, lung atelectasis due to pleural effusions on computed tomography scan.  3. Hypertension.  4. Coagulopathy, resolved, off of Coumadin.  5. Chronic obstructive pulmonary disease exacerbation.  6. Hemoptysis likely due to left tongue area ulceration,  aphthous ulcer.  7. Diabetes mellitus.  8. Renal insufficiency, resolved.  9. Atrial fibrillation.  10. Distended abdomen, constipation related likely.  11. Likely underlying chronic respiratory failure, now on oxygen therapy at 1 to 2 liters of oxygen through nasal cannula continuously.   DISCHARGE CONDITION: Stable.   DISCHARGE MEDICATIONS:  1. Patient is to resume amlodipine 10 mg p.o. daily.  2. Clonidine 0.1 mg p.o. once daily in the morning.  3. Imdur 30 mg p.o. daily.  4. Lasix 40 mg p.o. twice daily.  5. Atorvastatin 80 mg p.o. daily.  6. Metoprolol succinate 200 mg p.o. once daily.  7. Novolin N 68 units subcutaneously at bedtime and 100 units subcutaneously in the morning.  8. Warfarin 5 mg daily on Tuesdays, Wednesdays, Thursdays, Saturdays and Sundays and 7.5 mg p.o. on other days.  9. Omeprazole 20 mg p.o. daily.  10. Prednisone 10 mg tablets, 5 tablets which will be 50 mg p.o. once on 02/02/2012, then taper x 10 mg daily until stop.  11. Lisinopril 5 mg p.o. twice daily.  12. Albuterol ipratropium DuoNebs 1 inhalation 4 times daily.  13. Advair Diskus 250/50, 1 inhalation twice daily.  14. Patient is not to take aspirin unless recommended by primary care physician.   HOME OXYGEN:  2 liters of oxygen through nasal cannula continuously.   DIET: 2 grams salt, low fat, low cholesterol, carbohydrate controlled diet, mechanical soft.   ACTIVITY LIMITATIONS: As tolerated.   FOLLOW UP: Follow-up appointment with Dr. Greggory Contreras in two days after discharge. Patient is to resume his aspirin per primary care physician's recommendations when patient's tongue ulcerations are healed.   CONSULTANTS:  1. Dr. Gwen Pounds. 2. Dr. Andee Poles.   HISTORY OF PRESENT ILLNESS: Patient is a 74 year old African American male with past medical history significant for history of congestive heart failure in the past who presented to the hospital with complaints of shortness of breath. Please refer to Dr. Arlys John admission note on 01/30/2012. On arrival to the hospital patient's temperature 98.1, pulse 87, respiratory rate 34, blood pressure 137/60, saturation 93% on room air. Physical exam revealed lower extremity edema, also diminished breath sounds at bases all over the lung fields but patient was not in acute respiratory distress.   LABORATORY, DIAGNOSTIC AND RADIOLOGICAL DATA: Chest x-ray, portable single view, 01/30/2012 findings consistent with congestive heart failure and pulmonary interstitial edema. CT scan of abdomen 02/01/2012 revealed bilateral pleural effusions and basilar atelectasis, anasarca, coronary artery disease, cardiac pacer. No acute intra-abdominal abnormality noted.   Patient's lab data revealed elevated BUN to 20 glucose low at 44, B-type natriuretic peptide was elevated at 987. Patient's liver enzymes revealed mild elevation of total bilirubin to 1.4, AST elevated to 46. Cardiac enzymes showed CK total of 333, MB fraction was 7.3. Troponin was 0.26 on the first set. CK total of 331, MB fraction  of 7.4, and troponin 0.24 on the second and CK total of 322, MB fraction of 4.0, and troponin of 0.15 on third set. White blood cell count was normal at 6.7, hemoglobin 12.1, platelet count 135.  Coagulation panel revealed pro time 30.9, INR 3.0. EKG revealed electronic ventricular pacemaker. Patient's chest x-ray was concerning for congestive heart failure.   HOSPITAL COURSE: Patient was admitted to the hospital. He was started on IV Lasix. With this he improved clinically, however, patient's kidney function somewhat worsened. As mentioned above patient's creatinine was 1.21 on day of admission, 01/30/2012, however, with IV Lasix patient's creatinine worsened to 1.39. Patient's Lasix was changed to p.o. and decision was made to continue patient on oxygen therapy at home. He qualified for oxygen therapy. He is to continue oxygen at 1 to 2 liters of oxygen through nasal cannula. His oxygen saturation was 84% on room air on exertion and 93% on 1 liter of oxygen with exertion. We tried to obtain echocardiogram however, we were not able to have that done. Cardiology consultation was obtained. Dr. Gwen PoundsKowalski followed patient along. Dr. Gwen PoundsKowalski felt that patient had acute on chronic diastolic congestive heart failure and atrial fibrillation with sick sinus on appropriate medications and minimal elevation of troponin due to demand ischemia. He recommended to ambulate patient and follow for improvement of his symptoms but no further intervention was recommended for minimal elevation of troponin. Echocardiogram should be done as outpatient according to Dr. Gwen PoundsKowalski. Patient is to continue Lasix. He is to follow up with his primary care physician for echocardiogram to be scheduled as outpatient. Patient was also added ACE inhibitor. With this therapy his blood pressure improved as well as his diuresis. By the day of discharge patient lost approximately 2.1 liters of fluid with diuresis. His vital signs at discharge: Temperature 98.3, pulse 63, respiration rate 20, blood pressure 132/68, saturation 93% on 1 liter of oxygen through nasal cannula on exertion, however, as mentioned above, it was low at 84% on exertion on  room air qualifying patient for oxygen therapy.   In regards to hypertension, as mentioned above patient had new medications, ACE inhibitor, lisinopril, added to his medication regimen. It is recommended to advance medications as necessary and follow patient's creatinine levels since he is on new medication for blood pressure control.   For coagulopathy, patient's Coumadin was placed on hold just for short period of time because of significantly elevated pro time and also hemoptysis. Patient was having some cough with hemoptysis, was evaluated by ENT, Dr. Andee PolesVaught, who felt that patient's hemoptysis was very likely coming from a 5 mm area of ulceration of left lateral tongue near junction of anterior two thirds of tongue and posterior one third. He recommended Kenalog in [place] three times a day topically. Patient was given prescription for Kenalog. He is to follow up with his primary care physician and as needed with Dr. Andee PolesVaught.   Patient was also noted to have significant shortness of breath despite diuresis. It was felt that patient has an element of chronic obstructive pulmonary disease exacerbation. For this reason he was initiated on DuoNebs as well as Advair Diskus and steroids. Patient is to continue steroid taper and DuoNebs. With this therapy, as mentioned above, his condition overall improved.   For diabetes mellitus, patient's blood glucose levels were low, however, patient was receiving hospital diet. He is to follow up with his primary physician and make decisions about decreasing his insulin doses if blood glucose levels remain somewhat  at the lower end. We did not check patient's hemoglobin A1c unfortunately.   In regards to renal insufficiency, as mentioned above, after stopping IV Lasix patient's creatinine normalized, was back to normal levels, however, it is recommended to check patient's creatinine levels as outpatient especially since he is on ACE inhibitor now.   Patient was  complaining of some distended abdomen. It was felt to be constipation related. Patient was given medications to relieve his constipation which was relieved on 02/01/2012. CT scan of abdomen was done and it did not show any significant abnormalities in his abdomen except of pleural effusion as well as anasarca.   Patient is being discharged in stable condition with above-mentioned medications and follow up.   TIME SPENT: 40 minutes.   ____________________________ Katharina Caper, MD rv:cms D: 02/01/2012 15:33:15 ET T: 02/02/2012 11:59:25 ET JOB#: [id number]  cc: Katharina Caper, MD, <Dictator>  Jaleya Pebley MD ELECTRONICALLY SIGNED 02/13/2012 19:34

## 2014-06-22 NOTE — Consult Note (Signed)
Comments   I met with pt's family. They are concerned that pt has not been regularly seen by a neurologist. They do not feel that EEG report gives them enough information to make a decision. They request pt be transferred to [facility] so that he can be evaluated by a neurologist.   Electronic Signatures: Joss Friedel, Izora Gala (MD)  (Signed 02-Jan-14 12:37)  Authored: Palliative Care   Last Updated: 02-Jan-14 12:37 by Rad Gramling, Izora Gala (MD)

## 2014-06-22 NOTE — Consult Note (Signed)
    Comments   Multiple meetings/phone conversations with medical staff, [facility]son [facility]lickBowman & [facility], pt's daughter, Janora NorlanderRene Leyva. Plan is for Dr Olin PiaYapundich to see pt for 2nd opinion. Pt to be seen either 1/3 or 1/4. Will meet with family at that time. Family in agreement with plan. Pt's RN & attending aware and will help to facilitate meeting.   Electronic Signatures: Hattie Pine, Harriett SineNancy (MD)  (Signed 03-Jan-14 20:33)  Authored: Palliative Care   Last Updated: 03-Jan-14 20:33 by Tineshia Becraft, Harriett SineNancy (MD)

## 2014-06-22 NOTE — Consult Note (Signed)
Kyle Contreras, Amin DATE OF BIRTH:  07/30/1940  DATE OF CONSULTATION:  03/03/2012  REFERRING PHYSICIAN:  Dr. Elisabeth Pigeon CONSULTING PHYSICIAN:  Hemang K. Sherryll Burger, MD  REASON FOR CONSULTATION:  Prognostication in a patient who is in a coma.   HISTORY OF PRESENT ILLNESS:  The patient is a 74 year old African American gentleman, who was last seen normal on 02/24/2012. On 02/25/2012, the patient was called by family. He did not respond. As he lives by himself, the family went to check on him, and after the fire department broke the door, he was found lying down, unresponsive, surrounded by vomitus and urine. He was significantly hypoglycemic with a sugar of around 20.   He was intubated and brought to the ER.   Since then, he has not had significant neurological function, had occasional spontaneous reflexive movement of his lower extremities. He has been breathing over the vent.   He also had renal impairment and had elevation of his cardiac enzymes.   At baseline, the patient does have multiple medical problems and requires O2 on a regular basis.   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   He had an EEG, which showed progressive severe slowing more prominent in his posterior head region (bilateral occipital and parietal region).   The patient was planned to be transferred to the [facility] for further evaluation, but [facility] neurology recommended that the patient to be taken care of at the local hospital as they did not have anything else to offer.   PAST MEDICAL HISTORY:  Significant for atrial fibrillation on chronic anticoagulation with Coumadin, systemic hypertension, oxygen dependent on 2 L of oxygen, diabetes mellitus type 2, chronic kidney disease, chronic respiratory failure, sick sinus syndrome, status post dual-chamber pacemaker placement in 2001 with a generator change in 2013, gastroesophageal reflux  disease and history of TIA in 2001 (details not available).   PAST SURGICAL HISTORY:  Significant for pacemaker placement.   SOCIAL HISTORY:  Significant that he smoked for around 7 years or so in the past, but no alcohol or drug abuse. He lives by himself.   His social history is significant that he is divorced. He lives alone. He is a retired Paediatric nurse.   FAMILY HISTORY:  Significant that mother had diabetes and precoma.   REVIEW OF SYSTEMS:  Unobtainable.   I reviewed his current medication list and blood workup.   PHYSICAL EXAMINATION:  VITAL SIGNS:  His temperature is 99.3, pulse 60, respiratory rate 20, blood pressure 163/53, pulse ox 99%.   On his exam, he is intubated. He is not sedated. He looks critically ill, but well nourished.   He has tube feeds going on. He is breathing over the vent.   Per nursing, during suction, he occasionally responds.   He has generalized hypotonia. His pupils are very mutic and responsive. The patient has spontaneous roving eye movements that went from left to right, had occasional small jerk, but did not have a persistent jerking motion or gaze deviation.   He did also have downward gaze for some time.   He does not blink to threat. He does not respond to visual or verbal or physical stimulation, such as sternal rub.   He did not withdraw or move his extremities with painful stimuli.   He does have a positive triple flexion with stimulation of his sole.   He has generalized hypotonia. His reflexes are not  obtainable.    REVIEW OF RADIOLOGICAL DATA:  On his CT scan of the brain, he does have a calcification in his bilateral cerebellum, dentate nucleus region, as well as bilateral basal ganglia medial putamenal region.   He has an area of loss of gray and white distinction into bilateral occipital and posterior inferior temporal region bilaterally, probably left more than right.   He has lost sulci and gyri in the posterior head region.    The patient also has no signs of venous congestion.   I do not see any significant midline shift or any other compression or any other vital structures at present.   ASSESSMENT AND PLAN:  1.  Coma in a patient, who was found down with unknown period, for a maximum around 24 hour period, with hypoglycemia, a sugar of around 20, showing signs of cytotoxic injury (likely anoxic) in his bilateral occipital and posterior inferior temporal region, left more than right.  2.  With severe slowing on his EEG.  3.  I believe this patient has a very poor prognosis. Based on his neurologic exam, if he survives this episode, the most optimal outcome can be persistent vegetative state.  4.  I had an extensive discussion with both the daughters regarding his current condition and interpretation of the EEG and CT scan. I showed them the original images.  5.  The daughters had plenty of questions, such as the meaning of delta range frequency, how the delta range is obtained, whether these digital signals are processed and if it is, in which way, etc.  6.  One of the daughters is a Psychologist, counsellingresearch scientist in [employer]. I answered all her questions.  7.  I do believe that the patient has received a significant anoxic/ischemic/hypoglycemic or combined injury to his brain. The chances of a functional recovery are very poor.  8.  [facility] but [facility], Dr. Lorenso CourierPowers, who is actually chief of neurology there, mentioned that there is no further intervention they can offer.  9.  I also believe the same. Both the daughters will talk to other family members and will make a decision and convey their decision to palliative care physician or hospitalist physician.   I spent around 70 minutes for this patient. Feel free to contact me with any further questions.    ____________________________ Durene CalHemang K. Sherryll BurgerShah, MD hks:ms D: 03/03/2012 20:33:53 ET T: 03/03/2012 22:12:10  ET JOB#: [id number]  cc: Hemang K. Sherryll BurgerShah, MD, <Dictator> Durene CalHEMANG K [facility] MD ELECTRONICALLY SIGNED 03/11/2012 6:59

## 2014-06-22 NOTE — Consult Note (Signed)
Brief Consult Note: Diagnosis: coma,.   Patient was seen by consultant.   Consult note dictated.   Comments: - Briefly 74 yo AAM came with found down, unresponsive, severe hypoglycemic later found to have bilateral occipital infracts and signs of anoxic injury.  - Very poor prognosis - if he survives this event - high likely hood of persistant vegetative state.  - Discussed at lenght with both daughter, explained EEG results, showed CT scans etc. - will follow.  Electronic Signatures: Jolene ProvostShah, Nabil Bubolz Kalpeshkumar (MD)  (Signed 02-Jan-14 17:48)  Authored: Brief Consult Note   Last Updated: 02-Jan-14 17:48 by Jolene ProvostShah, Maimouna Rondeau Kalpeshkumar (MD)

## 2014-06-22 NOTE — Consult Note (Signed)
PATIENT NAME:  Kyle Contreras, Kyle Contreras MR#:  [id number] DATE OF BIRTH:  07-29-1940  DATE OF CONSULTATION:  02/26/2012  REFERRING PHYSICIAN: Dr. Cherlynn KaiserSainani.   CONSULTING PHYSICIAN:  Kyle BlinksBruce J. Quyen Cutsforth, MD  REASON FOR CONSULTATION: Acute myocardial infarction.   CHIEF COMPLAINT: The patient is intubated and sedated.   HISTORY OF PRESENT ILLNESS: This is a 74 year old male with known chronic atrial fibrillation with sick sinus syndrome status post pacemaker placement with diastolic dysfunction, hypertension, chronic obstructive pulmonary disease exacerbation, with hypoxia with respiratory failure, and the patient was found unresponsive. The patient had also diabetes,  for which he was on appropriate medication management, and has diabetic ketoacidosis at this point.  The patient has chronic kidney disease with a creatinine of 1.7, worsening at this time due to current illness. The patient has also an elevated troponin of 13, most consistent with subendocardial myocardial infarction due to likely hypoxia and other issues. The patient does have atrial fibrillation with ventricular pacing by pacemaker and EKG today.  Heart rate is controlled by telemetry and there is occasional supraventricular tachycardia as well. Currently the patient is sedated. The remainder of review of systems cannot be assessed due to sedation and intubation.   PAST MEDICAL HISTORY:  1.  Chronic kidney disease.  2.  Chronic atrial fibrillation.  3.  Hypertension.  4.  Chronic obstructive pulmonary disease.  5.  Diabetes.  6.  Chronic kidney disease.   FAMILY HISTORY: No apparent family members with early onset of cardiovascular disease.   SOCIAL HISTORY: Currently apparently denies alcohol or tobacco use.   ALLERGIES: No known drug allergies.   CURRENT MEDICATIONS: As listed.   Repeat EKG has shown a normal sinus rhythm with diffuse T-wave inversions.   PHYSICAL EXAMINATION:  VITAL SIGNS: Blood pressure is 100/60 bilaterally.  Heart rate is 72 and regular.  GENERAL: He is an ill-appearing male, intubated.  HEAD, EYES, EARS, NOSE, AND THROAT: No icterus, thyromegaly, ulcers, hemorrhage, or xanthelasma.  HEART: Regular rate and rhythm. Normal S1 and S2. No apparent murmur, gallop, or rub. PMI is diffuse. Carotid upstroke normal without bruit. Jugular venous pressure is slightly elevated.  LUNGS: Have diffuse wheezing and a few rhonchi.  ABDOMEN: Soft without hepatosplenomegaly or masses. Abdominal aorta is normal size without bruit.  EXTREMITIES: Showed 2+ radial, femoral, dorsal pedal pulses with no apparent lower extremity edema, cyanosis, clubbing, or ulcers.  NEUROLOGIC: The patient is intubated and sedated.   ASSESSMENT: A [age] year old male with hypertension, hyperlipidemia, chronic kidney disease, chronic obstructive pulmonary disease exacerbation with hypoxia, and respiratory failure with acute subendocardial myocardial infarction with diffuse T-wave changes, atrial fibrillation, sick sinus syndrome status post pacemaker placement, currently hemodynamically stable.   RECOMMENDATIONS:  1.  Heparin for further risk reduction in myocardial infarction.  2.  Continue treatment of possible exacerbation of chronic obstructive pulmonary disease and hypoxia with intubation and ventilation.  3.  Echocardiogram for extent of left ventricular systolic dysfunction, valvular heart disease contributing to above.  4.  Further serial ECG and enzymes to assess extent of myocardial infarction.  5.  Hydration for chronic kidney disease if able.  6.  Further treatment of tachycardia with beta blocker per nasogastric tube.  7.  Further evaluation and treatment options after above.    ____________________________ Kyle BlinksBruce J. Mccall Will, MD bjk:th D: 02/27/2012 12:07:00 ET T: 02/28/2012 16:27:36 ET JOB#: [id number]  cc: Kyle BlinksBruce J. Luv Mish, MD, <Dictator> Kyle BlinksBRUCE J Jeffery Gammell MD ELECTRONICALLY SIGNED 03/17/2012 8:26

## 2014-06-22 NOTE — Discharge Summary (Signed)
PATIENT NAME:  Kyle Contreras, Kyle Contreras MR#:  [id number] DATE OF BIRTH:  [birth year]  DATE OF ADMISSION:  02/25/2012 DATE OF DISCHARGE:  02-24-2013  ADMITTING PHYSICIAN:  Carney CornersAmir M. Rudene Rearwish, MD  DISCHARGING PHYSICIAN:  Enid Baasadhika Ajanee Buren, MD  PRIMARY CARE PHYSICIAN:  [facility].   CONSULTATIONS IN THE HOSPITAL:   1.  Pulmonary critical care consultation by Dr. Meredeth IdeFleming.  2.  Neurology consultation by Dr. Cristopher PeruHemang [name] and also moonlighter, Dr. Olin PiaYapundich.  3.  Nephrology consultation by Dr. Wynelle LinkKolluru and Dr. Mady HaagensenMunsoor Lateef.  4.  Palliative care consultation by Dr. Harvie JuniorPhifer.   DISCHARGE DIAGNOSES:   1.  Acute respiratory failure secondary to airway protection for mental status changes.  2.  Acute bilateral infarcts in bilateral occipital and left temporal lobes triggered by severe hypoglycemia.  3.  Anoxic encephalopathy/hypoxic hypoglycemic encephalopathy.  4.  Acute renal failure.  5.  Chronic obstructive pulmonary disease.  6.  History of congestive heart failure.  7.  Hypertension.  8.  Fever, central in origin.  9.  Diabetes mellitus with hypoglycemia on admission.  10.  Sick sinus syndrome, status post pacemaker placement.  11.  Gastroesophageal reflux disease.   DISCHARGE HOME MEDICATIONS TO HOSPICE HOME:   1.    Roxanol 20 mg/mL concentrate 0.25 mL every hour p.r.n. for pain.  2.  Lorazepam 0.5 mg 1 to 2 tablets orally or sublingually every 4 hours as needed for agitation/anxiety.   DIAGNOSTIC DATA:  From March 08, 2012 WBC 28.5, hemoglobin 12.3, hematocrit 33.4, platelet count 306. Sodium 147, potassium 4.8, chloride 112, bicarbonate 26, BUN 64, creatinine 1.2, glucose 150, calcium 8.1. Chest x-ray from March 05, 2012, showing _____ area of density in right lower lobe significantly decreased when compared to prior. Urine and blood cultures were negative on admission. CT of the head done on admission showing no acute intracranial process and chronic small vessel ischemic disease.    BRIEF HOSPITAL COURSE:  The patient is a 74 year old African-American male with a past medical history significant for diastolic CHF, atrial fibrillation and sick sinus, status post pacemaker, COPD on home oxygen, diabetes, systemic hypertension, who presented to the hospital after he was found down at home. He was seen a whole 24 hours later with blood sugar noted to be in the 20s when EMTs arrived.  1.  Hypoglycemic coma resulting in acute respiratory failure, requiring intubation and being on the vent. He also suffered from anoxic/hypoglycemic injury resulted in bilateral occipital and left temporal lobe infarct noted on the repeat CAT scan. He was on the vent. He was breathing spontaneously for several days; however, his neurological status was not improving at all. He was off of sedation for more than 4 days, but minimal opening of his eyes, no withdrawal to pain and posturing and deep suctioning without cough or gag reflex. Neurologists have done EEGs which showed diffuse slowing indicating cortical damage especially in the posterior regions. He also has unequal pupils. Because of his clinical deterioration and no improvement in his condition, family meetings are arranged with palliative care team and after discussion with all 3 daughters, the plan has been made to terminally extubate the patient, which was done in March 08, 2012. He was made comfort care. His vitals are still being maintained, so he is being moved to hospice home at this time. He still continuously spiking fevers probably central in origin. His repeat cultures are negative. He was treated with antibiotics for his possible pneumonia as seen on the chest x-ray.  He was on an insulin drip while he was in the ICU that has been turned off while he is going to hospice home.   TIME SPENT:  35 minutes.   PROGNOSIS:  Extremely poor.    ____________________________ Enid Baas, MD rk:si D: 04-08-12 16:39:00 ET T: 04-08-12  21:01:34 ET JOB#: [id number]  cc: Enid Baas, MD, <Dictator> Enid Baas MD ELECTRONICALLY SIGNED 03/11/2012 14:15

## 2014-06-22 NOTE — Consult Note (Signed)
Comments   I met with pt's daughter. She had multiple questions regarding how to proceed from here with pt's care, eg, trach/no trach, comfort care, disposition. I answered all of her questions and she took notes. She is now going to discuss with her siblings and get back to me with family's decisions.   Electronic Signatures for Addendum Section:  Cheney Gosch, Izora Gala (MD) (Signed Addendum 06-Jan-14 20:45)  Daughter, Rondle Lohse, returned my phone call. Michela Pitcher that family had been able to speak with neurologist and now needed to decide how they were going to proceed with pt's care. She is going to try to contact her siblings to find a time that they might meet with me to discuss.   Electronic Signatures: Latoya Diskin, Izora Gala (MD)  (Signed 06-Jan-14 11:24)  Authored: Palliative Care   Last Updated: 06-Jan-14 20:45 by Renardo Cheatum, Izora Gala (MD)

## 2014-06-22 NOTE — Consult Note (Signed)
PATIENT NAME:  Kyle Contreras, Kyle Contreras MR#:  [id number] DATE OF BIRTH:  September 14, 1940  DATE OF CONSULTATION:  02/28/2012  REFERRING PHYSICIAN: Dr. Rudene Rearwish  CONSULTING PHYSICIAN:  Elana Almobert A. Olin PiaYapundich, MD  REASON FOR CONSULTATION: Coma.   HISTORY OF PRESENT ILLNESS: Mr. Kyle Contreras is a 74 year old black male with a known prior history of diabetes, as well as congestive heart failure, chronic atrial fibrillation on Coumadin, as well as other chronic medical issues, underwent evaluation for coma.  History was according to the review of the hospital chart, conversations with the patient's family at the bedside.   Mr. Kyle Contreras lives independently.  He was found down on 02/25/2012, after last being seen normal 24 hours prior to that point.  According to the admission history and physical, the family tried to reach him but he would not answer.  They then entered the house and found the patient in his bed unresponsive with slight vomitus around his mouth and clothes soaked with urine.  His initial blood sugar check was noted to be only 20, and he received D50, and he was transferred to the emergency room.  He was intubated and since that time has remained comatose.  Head CT has been obtained on admission that was noted to be unremarkable.  For the last 3 days, the family indicates that he has remained unresponsive of any type of notable movement except maybe some slight movement in his feet according to his son.  Nursing personnel indicate that he has maybe had a few breaths above the ventilator and may cough or gag at times on suction but otherwise no other type of purposeful activity has been noted.  Additionally, there has been no noted seizure activity.  Neurologist consulted now for further evaluation.   PAST MEDICAL HISTORY: Is notable for a history of congestive heart failure with bilateral pleural effusion, hypertension, chronic atrial fibrillation with anticoagulation on Coumadin, COPD, type 2 diabetes, chronic kidney  disease, chronic respiratory failure, sick sinus syndrome, gastroesophageal reflux disease, history of transient ischemic attack in 2001 (diagnosis time lag is unclear).   PAST SURGICAL HISTORY: Includes pacemaker insertion in 2001, with pacemaker generator change in October 2013.    CURRENT MEDICATIONS: Include insulin, levofloxacin, Tylenol, albuterol inhaler, aspirin 324 mg daily, chlorhexidine, Colace, fluticasone inhaler , zantac injection, Senokot.   ALLERGIES: No known drug allergies.   SOCIAL HISTORY: He apparently is divorced, lives at home alone and is a retired Paediatric nursebarber.  He is completely independent in his ADLs, according to his family, at baseline.   FAMILY HISTORY: Notable for mother who suffered from diabetes and glaucoma.   REVIEW OF SYSTEMS: Neurological review of systems are as noted above.  Ten-point review of systems is obtained on admission, and I agree that this is documented.   PHYSICAL EXAMINATION:  GENERAL: An elderly black male, who is currently in no acute distress.   VITAL SIGNS: He is afebrile.  Vital signs stable. Most recent blood pressure is noted to be 120/52.   NEUROLOGIC:  He is comatose and intubated and has not received any type of recent sedation. He will not respond to any type of verbal commands.  His pupils are about 1 to 2 mm bilaterally and minimally reactive.  He appears to have conjugate eye movements.  He appears to have very slight corneal reflex noted.  Otherwise, cranial nerves 2 through 12 are grossly intact. Motor examination: He has no purposeful or spontaneous activity.  With deep pain applied in the axial location, he has  only the cerebrate posturing of his extremities.  Cerebellar exam could not be tested.  Deep tendon reflexes are trace to absent throughout with toes equivocal.   CARDIOVASCULAR: Distant heart sounds otherwise irregular rate and rhythm.  No evidence of carotid bruits.  EXTREMITIES: No evidence of cyanosis, clubbing, or edema.     DIAGNOSTIC STUDIES: Include a head CT performed on admission that was noted to be unremarkable.  INR on admission was noted to be 2.4.  His most recent labs from today reveal a chest x-ray with mild residual infiltrate of right lung base.  Glucose is 279 and BUN and creatinine of 31 and 1.6, sodium 138.  CBC reveals a white count of 10.6 with a platelet count of 133.    ASSESSMENT: Coma, possibly secondary to prolonged hypoglycemia.   DISCUSSION: Mr. Jent presents after being found down possibly for as long as 24 hours or more with the initial blood sugar of about 20.  He since that time has remained comatose despite lack of any type of sedative medications.  Initial head CT was unremarkable and examination at this time reveals essentially no purposeful activity with only decerebrate-type posturing.  Overall, the etiology of his coma at this point is unclear, but certainly the possibility of prolonged hypoglycemic insult would need to be considered.  Whether or not he may have had some type of unwitnessed anoxic event possibly from a seizure or other cardiac-related issue are other considerations.  I do not see any clinical evidence of overt seizure activity; however, subclinical seizure activity certainly cannot be excluded.    PLAN: At this point, I would like to go ahead and check an EEG, as well as a head MRI to complete our database.  I would continue to optimize his metabolic and infectious parameters as you were doing.  I have had a long discussion with the son and daughter at the bedside.  I have advised them that given the clinical findings of lack of any type of purposeful activity, lack of any improvement in the last few days, as well as the presenting scenario of initial hypoglycemia for possibly as long as a day are all very poor prognostic indicators.  Hopefully once we are able to get an EEG and MRI then a more accurate prognostication can be rendered.    Thank you very much for allowing  me to participate in the care of this very interesting and pleasant patient.        ____________________________ Elana Alm. Olin Pia, MD ray:th D: 02/27/2012 11:18:23 ET T: 02/28/2012 15:41:30 ET JOB#: [id number]  cc: Molly Maduro A. Olin Pia, MD, <Dictator> Theodosia Quay MD ELECTRONICALLY SIGNED 06/15/2012 22:52

## 2014-06-22 NOTE — Consult Note (Signed)
Comments   Met with pt's 3 daughters. Explained the process of extubation and comfort care. They are in agreement with this. Will proceed with extubation. Comfort care orders entered.   Electronic Signatures: Myna Freimark, Izora Gala (MD)  (Signed 07-Jan-14 14:45)  Authored: Palliative Care   Last Updated: 07-Jan-14 14:45 by Nyshawn Gowdy, Izora Gala (MD)

## 2014-06-24 NOTE — Op Note (Signed)
PATIENT NAME:  Kyle Contreras, Kyle Contreras MR#:  [id number] DATE OF BIRTH:  1940-12-10  DATE OF PROCEDURE:  06/01/2011  PREOPERATIVE DIAGNOSES:  1. Peripheral arterial disease and ulceration, left lower extremity.  2. Hypertension.  3. Diabetes.  4. History of stroke.   POSTOPERATIVE DIAGNOSES:  1. Peripheral arterial disease and ulceration, left lower extremity.  2. Hypertension.  3. Diabetes.  4. History of stroke.   PROCEDURES: 1. Catheter placement into left common femoral artery from right femoral approach.  2. Aortogram and selective left lower extremity angiogram.  3. StarClose closure device, right femoral artery.   SURGEON: Annice NeedyJason S. Dew, MD  ANESTHESIA: Local with moderate conscious sedation.   ESTIMATED BLOOD LOSS: Minimal.   FLUOROSCOPY TIME: 1.5 minutes.  CONTRAST USED: 50 mL.    INDICATION FOR PROCEDURE: 74 year old African American male with a nonhealing ulcer left lower extremity. He had mildly reduced ABI and biphasic waveforms seen on duplex studies. He is brought to the angiography suite for further evaluation and possible treatment. Risks and benefits were discussed. Informed consent was obtained.   DESCRIPTION OF PROCEDURE: Patient was brought to vascular interventional radiology suite, groins shaved and prepped, sterile surgical field was created. The right femoral head was localized with fluoroscopy and the right femoral artery was accessed without difficulty with a Seldinger needle. 3-J wire and 5 French sheath were placed. Pigtail catheter was placed in the aorta at the L1 level. This showed single renal vessels with no flow-limiting stenosis in the renal arteries. Aorta and iliac segments were patent without flow-limiting stenosis. I then hooked the aortic bifurcation, advanced to the left femoral head and selective left lower extremity angiogram was then performed. This showed patent common femoral artery, profunda femoris artery.  The superficial femoral and popliteal  arteries had several areas of calcified stenosis that were less than 50% and not flow-limiting. He then had a tibial trifurcation with occlusion of the anterior tibial artery with no distal reconstitution but patent posterior tibial and peroneal arteries distally without flow-limiting stenosis. At this point, I elected to terminate the procedure as there was not flow-limiting stenosis within the visualized vessels and there was no distal target for the third tibial vessel as well as the fact that the two vessels were present were large and brisk. Diagnostic catheter was removed. Oblique arteriogram was performed through the right femoral sheath and StarClose closure device deployed in the usual fashion with excellent hemostatic result. The patient tolerated procedure well and was taken to the recovery room in stable condition.   ____________________________ Annice NeedyJason S. Dew, MD jsd:cms D: 06/01/2011 08:39:57 ET T: 06/01/2011 08:52:29 ET JOB#: [id number]  cc: Annice NeedyJason S. Dew, MD, <Dictator> Angus PalmsSionne George, MD Annice NeedyJASON S DEW MD ELECTRONICALLY SIGNED 06/03/2011 15:04
